# Patient Record
Sex: Male | Born: 1960
Health system: Southern US, Community
[De-identification: ages and names within clinical notes are randomized; demographics above are authoritative.]

## PROBLEM LIST (undated history)

## (undated) DIAGNOSIS — K219 Gastro-esophageal reflux disease without esophagitis: Secondary | ICD-10-CM

## (undated) DIAGNOSIS — R7303 Prediabetes: Secondary | ICD-10-CM

## (undated) DIAGNOSIS — I519 Heart disease, unspecified: Secondary | ICD-10-CM

## (undated) DIAGNOSIS — I77810 Thoracic aortic ectasia: Secondary | ICD-10-CM

## (undated) DIAGNOSIS — N471 Phimosis: Secondary | ICD-10-CM

## (undated) DIAGNOSIS — G4733 Obstructive sleep apnea (adult) (pediatric): Secondary | ICD-10-CM

## (undated) DIAGNOSIS — N481 Balanitis: Secondary | ICD-10-CM

## (undated) DIAGNOSIS — I10 Essential (primary) hypertension: Secondary | ICD-10-CM

## (undated) DIAGNOSIS — K429 Umbilical hernia without obstruction or gangrene: Secondary | ICD-10-CM

## (undated) DIAGNOSIS — Q208 Other congenital malformations of cardiac chambers and connections: Secondary | ICD-10-CM

## (undated) DIAGNOSIS — Z972 Presence of dental prosthetic device (complete) (partial): Secondary | ICD-10-CM

## (undated) DIAGNOSIS — R0600 Dyspnea, unspecified: Secondary | ICD-10-CM

## (undated) DIAGNOSIS — E669 Obesity, unspecified: Secondary | ICD-10-CM

## (undated) DIAGNOSIS — E785 Hyperlipidemia, unspecified: Secondary | ICD-10-CM

## (undated) DIAGNOSIS — Z8659 Personal history of other mental and behavioral disorders: Secondary | ICD-10-CM

## (undated) DIAGNOSIS — E291 Testicular hypofunction: Secondary | ICD-10-CM

## (undated) DIAGNOSIS — I288 Other diseases of pulmonary vessels: Secondary | ICD-10-CM

## (undated) HISTORY — DX: Gastro-esophageal reflux disease without esophagitis: K21.9

## (undated) HISTORY — DX: Other diseases of pulmonary vessels: I28.8

## (undated) HISTORY — DX: Other congenital malformations of cardiac chambers and connections: Q20.8

## (undated) HISTORY — DX: Prediabetes: R73.03

## (undated) HISTORY — DX: Heart disease, unspecified: I51.9

## (undated) HISTORY — DX: Obstructive sleep apnea (adult) (pediatric): G47.33

## (undated) HISTORY — DX: Obesity, unspecified: E66.9

## (undated) HISTORY — PX: VASECTOMY: SHX75

## (undated) HISTORY — DX: Essential (primary) hypertension: I10

## (undated) HISTORY — DX: Testicular hypofunction: E29.1

## (undated) HISTORY — PX: APPENDECTOMY: SHX54

## (undated) HISTORY — PX: NO PAST SURGERIES: SHX2092

## (undated) HISTORY — DX: Hyperlipidemia, unspecified: E78.5

## (undated) HISTORY — DX: Personal history of other mental and behavioral disorders: Z86.59

## (undated) HISTORY — DX: Thoracic aortic ectasia: I77.810

## (undated) HISTORY — DX: Dyspnea, unspecified: R06.00

---

## 2002-09-15 ENCOUNTER — Encounter: Admission: RE | Admit: 2002-09-15 | Discharge: 2002-09-15 | Payer: Self-pay | Admitting: Otolaryngology

## 2002-09-15 ENCOUNTER — Encounter: Payer: Self-pay | Admitting: Otolaryngology

## 2002-11-24 ENCOUNTER — Ambulatory Visit (HOSPITAL_COMMUNITY): Admission: RE | Admit: 2002-11-24 | Discharge: 2002-11-24 | Payer: Self-pay | Admitting: Gastroenterology

## 2003-07-30 ENCOUNTER — Ambulatory Visit (HOSPITAL_COMMUNITY): Admission: RE | Admit: 2003-07-30 | Discharge: 2003-07-30 | Payer: Self-pay | Admitting: Family Medicine

## 2004-07-15 ENCOUNTER — Emergency Department (HOSPITAL_COMMUNITY): Admission: EM | Admit: 2004-07-15 | Discharge: 2004-07-15 | Payer: Self-pay | Admitting: *Deleted

## 2004-08-13 ENCOUNTER — Encounter: Admission: RE | Admit: 2004-08-13 | Discharge: 2004-08-13 | Payer: Self-pay | Admitting: Orthopaedic Surgery

## 2012-12-20 ENCOUNTER — Emergency Department: Payer: Self-pay | Admitting: Unknown Physician Specialty

## 2013-09-19 ENCOUNTER — Other Ambulatory Visit: Payer: Self-pay | Admitting: Family Medicine

## 2013-09-19 ENCOUNTER — Ambulatory Visit
Admission: RE | Admit: 2013-09-19 | Discharge: 2013-09-19 | Disposition: A | Discharge status: Home / Self Care | Payer: BC Managed Care – PPO | Source: Ambulatory Visit | Attending: Family Medicine | Admitting: Family Medicine

## 2013-09-19 DIAGNOSIS — M5430 Sciatica, unspecified side: Secondary | ICD-10-CM

## 2014-02-23 ENCOUNTER — Ambulatory Visit (INDEPENDENT_AMBULATORY_CARE_PROVIDER_SITE_OTHER): Payer: BC Managed Care – PPO | Admitting: Surgery

## 2014-03-16 ENCOUNTER — Ambulatory Visit (INDEPENDENT_AMBULATORY_CARE_PROVIDER_SITE_OTHER): Payer: BC Managed Care – PPO | Admitting: Surgery

## 2015-03-29 ENCOUNTER — Other Ambulatory Visit: Payer: Self-pay | Admitting: General Surgery

## 2015-07-11 ENCOUNTER — Other Ambulatory Visit (HOSPITAL_COMMUNITY): Payer: Self-pay | Admitting: Family Medicine

## 2015-07-11 ENCOUNTER — Telehealth (HOSPITAL_COMMUNITY): Payer: Self-pay | Admitting: *Deleted

## 2015-07-11 DIAGNOSIS — R0609 Other forms of dyspnea: Principal | ICD-10-CM

## 2015-07-18 ENCOUNTER — Other Ambulatory Visit: Payer: Self-pay | Admitting: Family Medicine

## 2015-07-18 ENCOUNTER — Ambulatory Visit
Admission: RE | Admit: 2015-07-18 | Discharge: 2015-07-18 | Disposition: A | Discharge status: Home / Self Care | Payer: BLUE CROSS/BLUE SHIELD | Source: Ambulatory Visit | Attending: Family Medicine | Admitting: Family Medicine

## 2015-07-18 ENCOUNTER — Ambulatory Visit (HOSPITAL_COMMUNITY): Payer: BLUE CROSS/BLUE SHIELD | Attending: Cardiovascular Disease

## 2015-07-18 ENCOUNTER — Other Ambulatory Visit: Payer: Self-pay

## 2015-07-18 ENCOUNTER — Encounter: Payer: Self-pay | Admitting: Cardiology

## 2015-07-18 DIAGNOSIS — Z87891 Personal history of nicotine dependence: Secondary | ICD-10-CM | POA: Insufficient documentation

## 2015-07-18 DIAGNOSIS — R079 Chest pain, unspecified: Secondary | ICD-10-CM

## 2015-07-18 DIAGNOSIS — I517 Cardiomegaly: Secondary | ICD-10-CM | POA: Diagnosis not present

## 2015-07-18 DIAGNOSIS — R0609 Other forms of dyspnea: Secondary | ICD-10-CM | POA: Insufficient documentation

## 2015-07-18 DIAGNOSIS — E669 Obesity, unspecified: Secondary | ICD-10-CM | POA: Diagnosis not present

## 2015-07-18 DIAGNOSIS — Z6835 Body mass index (BMI) 35.0-35.9, adult: Secondary | ICD-10-CM | POA: Diagnosis not present

## 2015-07-18 DIAGNOSIS — R0600 Dyspnea, unspecified: Secondary | ICD-10-CM | POA: Diagnosis present

## 2015-07-30 DIAGNOSIS — Z8659 Personal history of other mental and behavioral disorders: Secondary | ICD-10-CM | POA: Insufficient documentation

## 2015-07-30 DIAGNOSIS — E669 Obesity, unspecified: Secondary | ICD-10-CM | POA: Insufficient documentation

## 2015-07-30 DIAGNOSIS — G4733 Obstructive sleep apnea (adult) (pediatric): Secondary | ICD-10-CM | POA: Insufficient documentation

## 2015-07-30 DIAGNOSIS — K219 Gastro-esophageal reflux disease without esophagitis: Secondary | ICD-10-CM | POA: Insufficient documentation

## 2015-07-30 DIAGNOSIS — R7303 Prediabetes: Secondary | ICD-10-CM | POA: Insufficient documentation

## 2015-08-02 ENCOUNTER — Ambulatory Visit (INDEPENDENT_AMBULATORY_CARE_PROVIDER_SITE_OTHER): Payer: BLUE CROSS/BLUE SHIELD | Admitting: Cardiology

## 2015-08-02 ENCOUNTER — Encounter: Payer: Self-pay | Admitting: Cardiology

## 2015-08-02 VITALS — BP 118/86 | HR 73 | Ht 66.5 in | Wt 232.4 lb

## 2015-08-02 DIAGNOSIS — G4730 Sleep apnea, unspecified: Secondary | ICD-10-CM

## 2015-08-02 DIAGNOSIS — R7303 Prediabetes: Secondary | ICD-10-CM | POA: Diagnosis not present

## 2015-08-02 DIAGNOSIS — R0789 Other chest pain: Secondary | ICD-10-CM

## 2015-08-02 DIAGNOSIS — R0609 Other forms of dyspnea: Secondary | ICD-10-CM

## 2015-08-02 NOTE — Patient Instructions (Signed)
Medication Instructions:  The current medical regimen is effective;  continue present plan and medications.  Labwork: Please have fasting blood work (lipid)  Testing/Procedures: Your physician has requested that you have an exercise tolerance test. For further information please visit [email or web address] Please also follow instruction sheet, as given.  Follow-Up: Follow up with Dr Delton See ASAP.  If you need a refill on your cardiac medications before your next appointment, please call your pharmacy.  Thank you for choosing [facility]!!

## 2015-08-02 NOTE — Progress Notes (Addendum)
Cardiology Office Note   Date:  08/02/2015   ID:  Cody Villegas, DOB 26-Jun-1961, MRN [id number]  PCP:  Frederich Chick, MD  Cardiologist:  Dr. Carola Rhine    Chief Complaint  Patient presents with  . dyspnea on exertion  . Fatigue      History of Present Illness: Cody Villegas is a 55 y.o. male who presents for episodes of chest discomfort and SOB.  Referred by Dr. Hyman Hopes.  No prior cardiac hx.  He underwent echo 07/18/15   Left ventricle: The cavity size was mildly dilated. There was mild focal basal hypertrophy of the septum. Systolic function was normal. The estimated ejection fraction was in the range of 55% to 60%. Wall motion was normal; there were no regional wall motion abnormalities. Doppler parameters are consistent with abnormal left ventricular relaxation (grade 1 diastolic dysfunction). - Mitral valve: Redundant sub chordal apparatus. - Right ventricle: The cavity size was mildly dilated. Wall thickness was normal. - Atrial septum: No defect or patent foramen ovale was identified.  EKG from PCP with Q wave III. SR.   LABS:  BNP < 5, HGBA1c 5.9,  H/H stable Cr 0.92,  LFTs normal.  PSA 0.49 TSH  2.36  CXR with no cardiopulmonary abnormalities, + thoracic vertebrae fx chronic    He had a sleep study in 2007 but very mild so no treatment. No weight changes since.   Today he stated he develops chest discomfort a pressure with some pain going down arm more with waking stairs does not last long.  Also with SOB again resolves quickly.  He can play racketball without problems.  Also periodic neck pain.  Hx GERD, no recent lipids. Pt is adopted so no known family hx.     Past Medical History  Diagnosis Date  . Prediabetes   . Obesity   . Hx of major depression     resolved per pt 04/2006  . OSA (obstructive sleep apnea)   . Esophageal reflux   . Hypogonadism, male   . Dyspnea     Past Surgical History  Procedure Laterality Date  . No past  surgeries       Current Outpatient Prescriptions  Medication Sig Dispense Refill  . aspirin 81 MG tablet Take 81 mg by mouth daily.    . cetirizine (ZYRTEC) 10 MG tablet Take 10 mg by mouth daily.    . fluticasone (FLONASE) 50 MCG/ACT nasal spray Place 1 spray into both nostrils daily.    Marland Kitchen omeprazole (PRILOSEC) 40 MG capsule Take 40 mg by mouth daily as needed.     No current facility-administered medications for this visit.    Allergies:   Septra    Social History:  The patient  reports that he has never smoked. He does not have any smokeless tobacco history on file.   Family History:  The patient's family history is not on file. He was adopted.    ROS:  General:no colds or fevers, no weight changes Skin:no rashes or ulcers HEENT:no blurred vision, no congestion CV:see HPI PUL:see HPI GI:no diarrhea constipation or melena, no indigestion GU:no hematuria, no dysuria MS:no joint pain, no claudication Neuro:no syncope, no lightheadedness Endo:pre- diabetes, no thyroid disease  Wt Readings from Last 3 Encounters:  08/02/15 232 lb 6.4 oz (105.416 kg)     PHYSICAL EXAM: VS:  BP 118/86 mmHg  Pulse 73  Ht 5' 6.5" (1.689 m)  Wt 232 lb 6.4 oz (105.416 kg)  BMI 36.95 kg/m2 ,  BMI Body mass index is 36.95 kg/(m^2). General:Pleasant affect, NAD Skin:Warm and dry, brisk capillary refill HEENT:normocephalic, sclera clear, mucus membranes moist Neck:supple, no JVD, no bruits  Heart:S1S2 RRR without murmur, gallup, rub or click Lungs:clear without rales, rhonchi, or wheezes ZOX:WRUE, non tender, + BS, do not palpate liver spleen or masses Ext:no lower ext edema, 2+ pedal pulses, 2+ radial pulses Neuro:alert and oriented, MAE, follows commands, + facial symmetry    EKG:  EKG is ordered today. The ekg ordered today demonstrates SR with Q wave in III, no changes from previous.    Recent Labs: No results found for requested labs within last 365 days.    Lipid Panel No  results found for: CHOL, TRIG, HDL, CHOLHDL, VLDL, LDLCALC, LDLDIRECT     Other studies Reviewed: Additional studies/ records that were reviewed today include: labs and echo.   ASSESSMENT AND PLAN:  1.  Chest pain and SOB with exertion.  Will plan ETT.  Discussed with Dr. Excell Seltzer.  EKG without acute changes and Echo with G1DD.  Will check lipids.  Pt will follow up with Dr. Delton See.  Though if positive will address prior to appt. With her direction.      Current medicines are reviewed with the patient today.  The patient Has no concerns regarding medicines.  The following changes have been made:  See above Labs/ tests ordered today include:see above  Disposition:   FU:  see above  Signed, Leone Brand, NP  08/02/2015 8:52 AM    [facility] [address] [place], [place], [place]  [address]/ [address] [place], [place] Phone: [phone]; Fax: [phone]  [phone]

## 2015-08-13 ENCOUNTER — Telehealth (HOSPITAL_COMMUNITY): Payer: Self-pay

## 2015-08-13 NOTE — Telephone Encounter (Signed)
Encounter complete. 

## 2015-08-15 ENCOUNTER — Ambulatory Visit (HOSPITAL_COMMUNITY)
Admission: RE | Admit: 2015-08-15 | Discharge: 2015-08-15 | Disposition: A | Discharge status: Home / Self Care | Payer: BLUE CROSS/BLUE SHIELD | Source: Ambulatory Visit | Attending: Cardiology | Admitting: Cardiology

## 2015-08-15 ENCOUNTER — Other Ambulatory Visit (INDEPENDENT_AMBULATORY_CARE_PROVIDER_SITE_OTHER): Payer: BLUE CROSS/BLUE SHIELD | Admitting: *Deleted

## 2015-08-15 DIAGNOSIS — R0609 Other forms of dyspnea: Secondary | ICD-10-CM | POA: Insufficient documentation

## 2015-08-15 LAB — EXERCISE TOLERANCE TEST
CHL CUP MPHR: 167 {beats}/min
CHL RATE OF PERCEIVED EXERTION: 17
CSEPEDS: 4 s
CSEPEW: 11.8 METS
Exercise duration (min): 10 min
Peak HR: 160 {beats}/min
Percent HR: 95 %
Rest HR: 75 {beats}/min

## 2015-08-15 LAB — LIPID PANEL
CHOL/HDL RATIO: 3.8 ratio (ref <=5.0)
CHOLESTEROL: 151 mg/dL (ref 125–200)
HDL: 40 mg/dL (ref >=40)
LDL Cholesterol: 78 mg/dL (ref <130)
TRIGLYCERIDES: 165 mg/dL — AB (ref <150)
VLDL: 33 mg/dL — ABNORMAL HIGH (ref <30)

## 2015-08-16 ENCOUNTER — Telehealth: Payer: Self-pay | Admitting: *Deleted

## 2015-08-16 DIAGNOSIS — R0609 Other forms of dyspnea: Principal | ICD-10-CM

## 2015-08-16 DIAGNOSIS — R0689 Other abnormalities of breathing: Secondary | ICD-10-CM

## 2015-08-16 NOTE — Telephone Encounter (Signed)
Per Nada Boozer, NP, called pt and advised him that his ETT was normal and that Dr. Delton See wanted him to have a Lexiscan done.   Instructions for the Lexiscan have been discussed with the pt.  Order has been in EPIC.  Pt has been advised that someone from our office will call him and schedule. Pt verbalized understanding.

## 2015-08-20 ENCOUNTER — Telehealth (HOSPITAL_COMMUNITY): Payer: Self-pay | Admitting: *Deleted

## 2015-08-20 NOTE — Telephone Encounter (Signed)
Patient given detailed instructions per Myocardial Perfusion Study Information Sheet for the test on 08/22/15 at 745 Patient notified to arrive 15 minutes early and that it is imperative to arrive on time for appointment to keep from having the test rescheduled.  If you need to cancel or reschedule your appointment, please call the office within 24 hours of your appointment. Failure to do so may result in a cancellation of your appointment, and a $50 no show fee. Patient verbalized understanding.Darrielle Pflieger J Kashmere Daywalt, RN  

## 2015-08-22 ENCOUNTER — Ambulatory Visit (HOSPITAL_COMMUNITY): Payer: BLUE CROSS/BLUE SHIELD | Attending: Cardiovascular Disease

## 2015-08-22 DIAGNOSIS — R5383 Other fatigue: Secondary | ICD-10-CM | POA: Diagnosis not present

## 2015-08-22 DIAGNOSIS — I10 Essential (primary) hypertension: Secondary | ICD-10-CM | POA: Diagnosis not present

## 2015-08-22 DIAGNOSIS — R079 Chest pain, unspecified: Secondary | ICD-10-CM | POA: Insufficient documentation

## 2015-08-22 DIAGNOSIS — R0609 Other forms of dyspnea: Secondary | ICD-10-CM | POA: Diagnosis present

## 2015-08-22 DIAGNOSIS — R9439 Abnormal result of other cardiovascular function study: Secondary | ICD-10-CM | POA: Insufficient documentation

## 2015-08-22 LAB — MYOCARDIAL PERFUSION IMAGING
CHL CUP RESTING HR STRESS: 66 {beats}/min
CSEPPHR: 90 {beats}/min
LHR: 0.27
LVDIAVOL: 115 mL
LVSYSVOL: 49 mL
NUC STRESS TID: 0.93
SDS: 4
SRS: 3
SSS: 5

## 2015-08-22 MED ORDER — TECHNETIUM TC 99M SESTAMIBI GENERIC - CARDIOLITE
32.4000 | Freq: Once | INTRAVENOUS | Status: AC | PRN
  Administered 2015-08-22: 32 via INTRAVENOUS

## 2015-08-22 MED ORDER — TECHNETIUM TC 99M SESTAMIBI GENERIC - CARDIOLITE
10.2000 | Freq: Once | INTRAVENOUS | Status: AC | PRN
  Administered 2015-08-22: 10 via INTRAVENOUS

## 2015-08-22 MED ORDER — REGADENOSON 0.4 MG/5ML IV SOLN
0.4000 mg | Freq: Once | INTRAVENOUS | Status: AC
  Administered 2015-08-22: 0.4 mg via INTRAVENOUS

## 2015-08-23 ENCOUNTER — Telehealth: Payer: Self-pay | Admitting: Cardiology

## 2015-08-23 NOTE — Telephone Encounter (Signed)
Returned pts call and he has been made aware of his stress test results. 

## 2015-08-23 NOTE — Telephone Encounter (Signed)
Returning call from this morning,does not know who called.He says it might be his test results.

## 2015-09-03 NOTE — Progress Notes (Signed)
Patient ID: Cody Villegas, male   DOB: 1960/07/31, 55 y.o.   MRN: [id number]    Cardiology Office Note  Date:  09/03/2015   ID:  Cody Villegas, DOB December 15, 1960, MRN [id number]  PCP:  Cody Chick, MD  Cardiologist:  Dr. Carola Villegas    Chief complain: Chest pain   History of Present Illness: Cody Villegas is a 55 y.o. male who presents for episodes of chest discomfort and SOB.  Referred by Dr. Hyman Villegas.  No prior cardiac hx.  He underwent echo 07/18/15   Left ventricle: The cavity size was mildly dilated. There was mild focal basal hypertrophy of the septum. Systolic function was normal. The estimated ejection fraction was in the range of 55% to 60%. Wall motion was normal; there were no regional wall motion abnormalities. Doppler parameters are consistent with abnormal left ventricular relaxation (grade 1 diastolic dysfunction). - Mitral valve: Redundant sub chordal apparatus. - Right ventricle: The cavity size was mildly dilated. Wall thickness was normal. - Atrial septum: No defect or patent foramen ovale was identified.  EKG from PCP with Q wave III. SR.   LABS:  BNP < 5, HGBA1c 5.9,  Villegas/Villegas stable Cr 0.92,  LFTs normal.  PSA 0.49 TSH  2.36  CXR with no cardiopulmonary abnormalities, + thoracic vertebrae fx chronic    He had a sleep study in 2007 but very mild so no treatment. No weight changes since.   09/04/15 - Today he stated he develops chest discomfort and cough in the last few days, no fever or chills, he walks a lot at work and has nonexertional chest pain palpitations or syncope. He states that his diet has a lot of from for improvement, Hx GERD. Pt is adopted so no known family hx.    Past Medical History  Diagnosis Date  . Prediabetes   . Obesity   . Hx of major depression     resolved per pt 04/2006  . OSA (obstructive sleep apnea)   . Esophageal reflux   . Hypogonadism, male   . Dyspnea     Past Surgical History  Procedure Laterality Date  . No  past surgeries      Current Outpatient Prescriptions  Medication Sig Dispense Refill  . aspirin 81 MG tablet Take 81 mg by mouth daily.    . cetirizine (ZYRTEC) 10 MG tablet Take 10 mg by mouth daily.    . fluticasone (FLONASE) 50 MCG/ACT nasal spray Place 1 spray into both nostrils daily.    Marland Kitchen omeprazole (PRILOSEC) 40 MG capsule Take 40 mg by mouth daily as needed.     No current facility-administered medications for this visit.   Allergies:   Septra   Social History:  The patient  reports that he has never smoked. He does not have any smokeless tobacco history on file.   Family History:  The patient's family history is not on file. He was adopted.   ROS:  General:no colds or fevers, no weight changes Skin:no rashes or ulcers HEENT:no blurred vision, no congestion CV:see HPI PUL:see HPI GI:no diarrhea constipation or melena, no indigestion GU:no hematuria, no dysuria MS:no joint pain, no claudication Neuro:no syncope, no lightheadedness Endo:pre- diabetes, no thyroid disease  Wt Readings from Last 3 Encounters:  08/02/15 232 lb 6.4 oz (105.416 kg)    PHYSICAL EXAM: VS:  There were no vitals taken for this visit. , BMI There is no weight on file to calculate BMI. General:Pleasant affect, NAD Skin:Warm and dry, brisk capillary refill  HEENT:normocephalic, sclera clear, mucus membranes moist Neck:supple, no JVD, no bruits  Heart:S1S2 RRR without murmur, gallup, rub or click Lungs:clear without rales, rhonchi, or wheezes ZOX:WRUEAbd:soft, non tender, + BS, do not palpate liver spleen or masses Ext:no lower ext edema, 2+ pedal pulses, 2+ radial pulses Neuro:alert and oriented, MAE, follows commands, + facial symmetry  EKG:  EKG is ordered today. The ekg ordered today demonstrates SR with Q wave in III, no changes from previous.   Recent Labs: No results found for requested labs within last 365 days.   Lipid Panel    Component Value Date/Time   CHOL 151 08/15/2015 0907   TRIG  165* 08/15/2015 0907   HDL 40 08/15/2015 0907   CHOLHDL 3.8 08/15/2015 0907   VLDL 33* 08/15/2015 0907   LDLCALC 78 08/15/2015 0907   Lexiscan nuclear stress test: 08/22/2015  The left ventricular ejection fraction is normal (55-65%).  Nuclear stress EF: 57%.  There was no ST segment deviation noted during stress.  This is a low risk study.  Low risk stress nuclear study with a small, moderate intensity, fixed apical defect consistent with thinning; no ischemia; EF 57 with normal wall motion.    ASSESSMENT AND PLAN:  1.  Chest pain and SOB with exertion.  Normal ETT and Lexiscan nuclear scan, LVEF 57%. EKG without acute changes and Echo with G1DD.   No further ischemic workup.  2. Hyperlipidemia - elevated TAG 165, LDL 78, HDL 40. He's advised on lifestyle modifications.  Follow up in 2 years.   Signed, Cody Villegas, Cody Villegas H, MD  09/03/2015 8:03 AM    [facility] [address], [place], [place]  [address]/ [address] [place], [place] Phone: [phone]; Fax: [phone]  [phone]

## 2015-09-04 ENCOUNTER — Ambulatory Visit (INDEPENDENT_AMBULATORY_CARE_PROVIDER_SITE_OTHER): Payer: BLUE CROSS/BLUE SHIELD | Admitting: Cardiology

## 2015-09-04 ENCOUNTER — Encounter: Payer: Self-pay | Admitting: Cardiology

## 2015-09-04 VITALS — BP 120/62 | HR 84 | Ht 65.0 in | Wt 231.0 lb

## 2015-09-04 DIAGNOSIS — E785 Hyperlipidemia, unspecified: Secondary | ICD-10-CM

## 2015-09-04 DIAGNOSIS — R072 Precordial pain: Secondary | ICD-10-CM | POA: Diagnosis not present

## 2015-09-04 NOTE — Patient Instructions (Signed)
Your physician recommends that you continue on your current medications as directed. Please refer to the Current Medication list given to you today.    Your physician wants you to follow-up in: 2 YEARS WITH DR NELSON You will receive a reminder letter in the mail two months in advance. If you don't receive a letter, please call our office to schedule the follow-up appointment.  

## 2016-01-08 DIAGNOSIS — F4323 Adjustment disorder with mixed anxiety and depressed mood: Secondary | ICD-10-CM | POA: Diagnosis not present

## 2016-02-11 DIAGNOSIS — F4323 Adjustment disorder with mixed anxiety and depressed mood: Secondary | ICD-10-CM | POA: Diagnosis not present

## 2016-02-20 DIAGNOSIS — F4323 Adjustment disorder with mixed anxiety and depressed mood: Secondary | ICD-10-CM | POA: Diagnosis not present

## 2016-02-27 DIAGNOSIS — F4323 Adjustment disorder with mixed anxiety and depressed mood: Secondary | ICD-10-CM | POA: Diagnosis not present

## 2016-04-08 DIAGNOSIS — K6289 Other specified diseases of anus and rectum: Secondary | ICD-10-CM | POA: Diagnosis not present

## 2016-04-14 DIAGNOSIS — F4323 Adjustment disorder with mixed anxiety and depressed mood: Secondary | ICD-10-CM | POA: Diagnosis not present

## 2016-04-27 DIAGNOSIS — K429 Umbilical hernia without obstruction or gangrene: Secondary | ICD-10-CM | POA: Diagnosis not present

## 2016-04-27 DIAGNOSIS — M6208 Separation of muscle (nontraumatic), other site: Secondary | ICD-10-CM | POA: Diagnosis not present

## 2016-04-27 DIAGNOSIS — K641 Second degree hemorrhoids: Secondary | ICD-10-CM | POA: Diagnosis not present

## 2016-04-27 DIAGNOSIS — K645 Perianal venous thrombosis: Secondary | ICD-10-CM | POA: Diagnosis not present

## 2016-05-07 DIAGNOSIS — F4323 Adjustment disorder with mixed anxiety and depressed mood: Secondary | ICD-10-CM | POA: Diagnosis not present

## 2016-06-02 DIAGNOSIS — F4323 Adjustment disorder with mixed anxiety and depressed mood: Secondary | ICD-10-CM | POA: Diagnosis not present

## 2016-06-16 DIAGNOSIS — F4323 Adjustment disorder with mixed anxiety and depressed mood: Secondary | ICD-10-CM | POA: Diagnosis not present

## 2016-07-07 DIAGNOSIS — F4323 Adjustment disorder with mixed anxiety and depressed mood: Secondary | ICD-10-CM | POA: Diagnosis not present

## 2016-07-28 DIAGNOSIS — Z125 Encounter for screening for malignant neoplasm of prostate: Secondary | ICD-10-CM | POA: Diagnosis not present

## 2016-07-28 DIAGNOSIS — E782 Mixed hyperlipidemia: Secondary | ICD-10-CM | POA: Diagnosis not present

## 2016-07-28 DIAGNOSIS — R7303 Prediabetes: Secondary | ICD-10-CM | POA: Diagnosis not present

## 2016-07-28 DIAGNOSIS — Z6835 Body mass index (BMI) 35.0-35.9, adult: Secondary | ICD-10-CM | POA: Diagnosis not present

## 2016-07-28 DIAGNOSIS — Z0000 Encounter for general adult medical examination without abnormal findings: Secondary | ICD-10-CM | POA: Diagnosis not present

## 2016-08-04 DIAGNOSIS — F4323 Adjustment disorder with mixed anxiety and depressed mood: Secondary | ICD-10-CM | POA: Diagnosis not present

## 2016-09-03 DIAGNOSIS — F4323 Adjustment disorder with mixed anxiety and depressed mood: Secondary | ICD-10-CM | POA: Diagnosis not present

## 2016-09-10 ENCOUNTER — Ambulatory Visit
Admission: RE | Admit: 2016-09-10 | Discharge: 2016-09-10 | Disposition: A | Discharge status: Home / Self Care | Payer: BLUE CROSS/BLUE SHIELD | Source: Ambulatory Visit | Attending: Family Medicine | Admitting: Family Medicine

## 2016-09-10 ENCOUNTER — Other Ambulatory Visit: Payer: Self-pay | Admitting: Family Medicine

## 2016-09-10 DIAGNOSIS — M25511 Pain in right shoulder: Secondary | ICD-10-CM

## 2016-09-10 DIAGNOSIS — G8929 Other chronic pain: Secondary | ICD-10-CM

## 2016-09-10 DIAGNOSIS — M545 Low back pain: Secondary | ICD-10-CM | POA: Diagnosis not present

## 2016-09-10 DIAGNOSIS — M19011 Primary osteoarthritis, right shoulder: Secondary | ICD-10-CM | POA: Diagnosis not present

## 2016-09-22 DIAGNOSIS — F4323 Adjustment disorder with mixed anxiety and depressed mood: Secondary | ICD-10-CM | POA: Diagnosis not present

## 2016-10-22 DIAGNOSIS — F4323 Adjustment disorder with mixed anxiety and depressed mood: Secondary | ICD-10-CM | POA: Diagnosis not present

## 2016-10-28 DIAGNOSIS — Z125 Encounter for screening for malignant neoplasm of prostate: Secondary | ICD-10-CM | POA: Diagnosis not present

## 2016-11-19 DIAGNOSIS — F4323 Adjustment disorder with mixed anxiety and depressed mood: Secondary | ICD-10-CM | POA: Diagnosis not present

## 2016-12-03 DIAGNOSIS — F4323 Adjustment disorder with mixed anxiety and depressed mood: Secondary | ICD-10-CM | POA: Diagnosis not present

## 2016-12-09 DIAGNOSIS — Z5181 Encounter for therapeutic drug level monitoring: Secondary | ICD-10-CM | POA: Diagnosis not present

## 2016-12-09 DIAGNOSIS — G8929 Other chronic pain: Secondary | ICD-10-CM | POA: Diagnosis not present

## 2016-12-09 DIAGNOSIS — M791 Myalgia: Secondary | ICD-10-CM | POA: Diagnosis not present

## 2016-12-09 DIAGNOSIS — M25561 Pain in right knee: Secondary | ICD-10-CM | POA: Diagnosis not present

## 2016-12-09 DIAGNOSIS — R399 Unspecified symptoms and signs involving the genitourinary system: Secondary | ICD-10-CM | POA: Diagnosis not present

## 2016-12-14 ENCOUNTER — Other Ambulatory Visit: Payer: Self-pay | Admitting: Family Medicine

## 2016-12-14 ENCOUNTER — Ambulatory Visit
Admission: RE | Admit: 2016-12-14 | Discharge: 2016-12-14 | Disposition: A | Discharge status: Home / Self Care | Payer: BLUE CROSS/BLUE SHIELD | Source: Ambulatory Visit | Attending: Family Medicine | Admitting: Family Medicine

## 2016-12-14 DIAGNOSIS — G8929 Other chronic pain: Secondary | ICD-10-CM

## 2016-12-14 DIAGNOSIS — M1711 Unilateral primary osteoarthritis, right knee: Secondary | ICD-10-CM | POA: Diagnosis not present

## 2017-01-05 DIAGNOSIS — F4323 Adjustment disorder with mixed anxiety and depressed mood: Secondary | ICD-10-CM | POA: Diagnosis not present

## 2017-01-11 DIAGNOSIS — F4323 Adjustment disorder with mixed anxiety and depressed mood: Secondary | ICD-10-CM | POA: Diagnosis not present

## 2017-02-04 DIAGNOSIS — F4323 Adjustment disorder with mixed anxiety and depressed mood: Secondary | ICD-10-CM | POA: Diagnosis not present

## 2017-03-25 DIAGNOSIS — M1711 Unilateral primary osteoarthritis, right knee: Secondary | ICD-10-CM | POA: Diagnosis not present

## 2017-04-15 DIAGNOSIS — F4323 Adjustment disorder with mixed anxiety and depressed mood: Secondary | ICD-10-CM | POA: Diagnosis not present

## 2017-06-16 DIAGNOSIS — G8929 Other chronic pain: Secondary | ICD-10-CM | POA: Diagnosis not present

## 2017-06-16 DIAGNOSIS — R5382 Chronic fatigue, unspecified: Secondary | ICD-10-CM | POA: Diagnosis not present

## 2017-06-16 DIAGNOSIS — E669 Obesity, unspecified: Secondary | ICD-10-CM | POA: Diagnosis not present

## 2017-06-16 DIAGNOSIS — F4323 Adjustment disorder with mixed anxiety and depressed mood: Secondary | ICD-10-CM | POA: Diagnosis not present

## 2017-08-12 DIAGNOSIS — F4323 Adjustment disorder with mixed anxiety and depressed mood: Secondary | ICD-10-CM | POA: Diagnosis not present

## 2017-09-02 ENCOUNTER — Ambulatory Visit: Payer: BLUE CROSS/BLUE SHIELD | Admitting: Cardiology

## 2017-09-02 ENCOUNTER — Encounter: Payer: Self-pay | Admitting: Cardiology

## 2017-09-02 VITALS — BP 130/86 | HR 87 | Ht 66.5 in | Wt 228.0 lb

## 2017-09-02 DIAGNOSIS — E785 Hyperlipidemia, unspecified: Secondary | ICD-10-CM

## 2017-09-02 DIAGNOSIS — R072 Precordial pain: Secondary | ICD-10-CM | POA: Diagnosis not present

## 2017-09-02 DIAGNOSIS — R079 Chest pain, unspecified: Secondary | ICD-10-CM

## 2017-09-02 DIAGNOSIS — R0609 Other forms of dyspnea: Secondary | ICD-10-CM | POA: Diagnosis not present

## 2017-09-02 MED ORDER — METOPROLOL TARTRATE 50 MG PO TABS: ORAL_TABLET | ORAL | 0 refills | Status: DC

## 2017-09-02 NOTE — Progress Notes (Signed)
09/02/2017 Orvilla FusMarty Donahey   10/22/1960  [id number]  Primary Physician Shirlean MylarWebb, Carol, MD Primary Cardiologist: Dr. Delton SeeNelson   Reason for Visit/CC: Exertional Dyspnea/ Chest discomfort  HPI:  Orvilla FusMarty Wille is a 57 y.o. male who is being seen today for exertional dyspnea. He has been followed by Dr. Delton SeeNelson. Last seen in 2017. He underwent evaluation at that time for CP and dyspnea. 2D echo showed normal LVEF and G1DD. No valvular abnormalities. He also had a ETT which was abnormal. Blood pressure demonstrated a hypotensive response to exercise in stage 3 going from 154/7390mmHg to 120/6796mmHg but then increased to 124/9890mmHg in recovery with a peak in recovery of 205/296mmHg. There was Horizontal to upsloping ST segment depression of 1 mm noted during stress in the inferor leads, returning to baseline after 1-5 minutes of recovery. Dr. Delton SeeNelson recommended further testing and ordered a NST. This showed a small, moderate intensity, fixed apical defect consistent with thinning; no ischemia; EF 57 with normal wall motion. No further ischemic w/u was recommended at that time.   Additional cardiac risk factors include DLD and obesity. No tobacco history and no HTN. He is not currently on medications for cholesterol. He has had mildly elevated TGs and LDLs in the 80s. He admits that his diet is poor. He eats out a lot and eats a lot of fast food/ junk food. He has some abdominal obesity. He is adopted thus he is unsure of his family history.   He presents to clinic today with complaint of exertional dyspnea/ mild chest discomfort walking up inclines and steps. This is new for him. He has no difficulties with flat surfaces. His EKG shows NSR with Qwave in III, no change from previous EKG. He is currently CP free. No dyspnea. Volume is stable. No signs of fluid overload. Lungs are CTAB. BP is 130/86. HR 87 bpm.    Cardiac Studies  NST 08/22/15 Study Highlights    The left ventricular ejection fraction is normal  (55-65%).  Nuclear stress EF: 57%.  There was no ST segment deviation noted during stress.  This is a low risk study.   Low risk stress nuclear study with a small, moderate intensity, fixed apical defect consistent with thinning; no ischemia; EF 57 with normal wall motion.   2D Echo 07/18/2015 Study Conclusions  - Left ventricle: The cavity size was mildly dilated. There was   mild focal basal hypertrophy of the septum. Systolic function was   normal. The estimated ejection fraction was in the range of 55%   to 60%. Wall motion was normal; there were no regional wall   motion abnormalities. Doppler parameters are consistent with   abnormal left ventricular relaxation (grade 1 diastolic   dysfunction). - Mitral valve: Redundant sub chordal apparatus. - Right ventricle: The cavity size was mildly dilated. Wall   thickness was normal. - Atrial septum: No defect or patent foramen ovale was identified.   Current Meds  Medication Sig  . fluticasone (FLONASE) 50 MCG/ACT nasal spray Place 1 spray into both nostrils daily.  Marland Kitchen. omeprazole (PRILOSEC) 40 MG capsule Take 40 mg by mouth daily as needed.   Allergies  Allergen Reactions  . Septra [Sulfamethoxazole-Trimethoprim] Rash   Past Medical History:  Diagnosis Date  . Dyspnea   . Esophageal reflux   . Hx of major depression    resolved per pt 04/2006  . Hypogonadism, male   . Obesity   . OSA (obstructive sleep apnea)   . Prediabetes  Family History  Adopted: Yes   Past Surgical History:  Procedure Laterality Date  . NO PAST SURGERIES     Social History   Socioeconomic History  . Marital status: Married    Spouse name: Not on file  . Number of children: Not on file  . Years of education: Not on file  . Highest education level: Not on file  Social Needs  . Financial resource strain: Not on file  . Food insecurity - worry: Not on file  . Food insecurity - inability: Not on file  . Transportation needs - medical:  Not on file  . Transportation needs - non-medical: Not on file  Occupational History  . Not on file  Tobacco Use  . Smoking status: Never Smoker  . Smokeless tobacco: Never Used  Substance and Sexual Activity  . Alcohol use: Not on file  . Drug use: No  . Sexual activity: Not on file  Other Topics Concern  . Not on file  Social History Narrative  . Not on file     Review of Systems: General: negative for chills, fever, night sweats or weight changes.  Cardiovascular: negative for chest pain, dyspnea on exertion, edema, orthopnea, palpitations, paroxysmal nocturnal dyspnea or shortness of breath Dermatological: negative for rash Respiratory: negative for cough or wheezing Urologic: negative for hematuria Abdominal: negative for nausea, vomiting, diarrhea, bright red blood per rectum, melena, or hematemesis Neurologic: negative for visual changes, syncope, or dizziness All other systems reviewed and are otherwise negative except as noted above.   Physical Exam:  Blood pressure 130/86, pulse 87, height 5' 6.5" (1.689 m), weight 228 lb (103.4 kg).  General appearance: alert, cooperative and no distress Neck: no carotid bruit and no JVD Lungs: clear to auscultation bilaterally Heart: regular rate and rhythm, S1, S2 normal, no murmur, click, rub or gallop Extremities: extremities normal, atraumatic, no cyanosis or edema Pulses: 2+ and symmetric Skin: Skin color, texture, turgor normal. No rashes or lesions Neurologic: Grossly normal  EKG NSR with Q wave in lead III, unchanged from previous -- personally reviewed   ASSESSMENT AND PLAN:   1. Exertional Dyspnea: occurs walking up stairs and inclines, new from baseline. No symptoms walking on flat surfaces. He also has mild exertional CP with stairs and inclines that improve with rest. He had a low risk NST and normal 2D echo in 2017. Cardiac risk factors include DLD and obesity. He is adopted, thus he has no record of family  history, thus unsure if high risk for premature CAD. His EKG shows a Q wave in lead III, also noted on previous EKGs. Given his symptoms and history, we will order a coronary CTA with calcium scoring to assess for coronary artery disease. Will prescribe low dose BB to take morning to test to help lower HR. We will also obtain an updated fasting lipid panel to reassess cholesterol. F/u after coronary CTA.    Akin Yi Delmer Islam, MHS [facility] 09/02/2017 3:56 PM

## 2017-09-02 NOTE — Patient Instructions (Addendum)
Medication Instructions:  Your physician recommends that you continue on your current medications as directed. Please refer to the Current Medication list given to you today.   Labwork: TODAY:  BMET  09/09/17: FASTING LIPID/LFT Testing/Procedures:  None ordered  Follow-Up: Your physician recommends that you schedule a follow-up appointment in: 1-2 WEEKS AFTER CT   Any Other Special Instructions Will Be Listed Below (If Applicable).   [facility] at xx:xx AM (30-45 minutes prior to test start time)  [facility] [address] [place], [place] [address] [phone]  Proceed to the [facility] Radiology Department (First Floor).  Please follow these instructions carefully (unless otherwise directed):  Hold all erectile dysfunction medications at least 48 hours prior to test.  On the Night Before the Test: . Drink plenty of water. . Do not consume any caffeinated/decaffeinated beverages or chocolate 12 hours prior to your test. . Do not take any antihistamines 12 hours prior to your test.   On the Day of the Test: . Drink plenty of water. Do not drink any water within one hour of the test. . Do not eat any food 4 hours prior to the test. . You may take your regular medications prior to the test. . Take 50 mg of lopressor (metoprolol) one hour before the test.  After the Test: . Drink plenty of water. . After receiving IV contrast, you may experience a mild flushed feeling. This is normal. . On occasion, you may experience a mild rash up to 24 hours after the test. This is not dangerous. If this occurs, you can take Benadryl 25 mg and increase your fluid intake. . If you experience trouble breathing, this can be serious. If it is severe call 911 IMMEDIATELY. If it is mild, please call our office. . If you take any of these medications: Glipizide/Metformin, Avandament, Glucavance, please do not take 48  hours after completing test.   If you need a refill on your cardiac medications before your next appointment, please call your pharmacy.

## 2017-09-03 LAB — BASIC METABOLIC PANEL
BUN / CREAT RATIO: 19 (ref 9–20)
BUN: 17 mg/dL (ref 6–24)
CHLORIDE: 100 mmol/L (ref 96–106)
CO2: 26 mmol/L (ref 20–29)
Calcium: 9.6 mg/dL (ref 8.7–10.2)
Creatinine, Ser: 0.9 mg/dL (ref 0.76–1.27)
GFR calc non Af Amer: 96 mL/min/{1.73_m2} (ref >59)
GFR, EST AFRICAN AMERICAN: 111 mL/min/{1.73_m2} (ref >59)
GLUCOSE: 95 mg/dL (ref 65–99)
Potassium: 4.2 mmol/L (ref 3.5–5.2)
Sodium: 139 mmol/L (ref 134–144)

## 2017-09-09 ENCOUNTER — Other Ambulatory Visit: Payer: BLUE CROSS/BLUE SHIELD

## 2017-09-09 DIAGNOSIS — R072 Precordial pain: Secondary | ICD-10-CM | POA: Diagnosis not present

## 2017-09-09 DIAGNOSIS — R079 Chest pain, unspecified: Secondary | ICD-10-CM | POA: Diagnosis not present

## 2017-09-09 DIAGNOSIS — R0609 Other forms of dyspnea: Secondary | ICD-10-CM | POA: Diagnosis not present

## 2017-09-09 DIAGNOSIS — E785 Hyperlipidemia, unspecified: Secondary | ICD-10-CM

## 2017-09-09 LAB — LIPID PANEL
Chol/HDL Ratio: 3.3 ratio (ref 0.0–5.0)
Cholesterol, Total: 157 mg/dL (ref 100–199)
HDL: 48 mg/dL (ref >39)
LDL CALC: 88 mg/dL (ref 0–99)
Triglycerides: 104 mg/dL (ref 0–149)
VLDL Cholesterol Cal: 21 mg/dL (ref 5–40)

## 2017-09-09 LAB — HEPATIC FUNCTION PANEL
ALBUMIN: 4.4 g/dL (ref 3.5–5.5)
ALT: 23 IU/L (ref 0–44)
AST: 27 IU/L (ref 0–40)
Alkaline Phosphatase: 113 IU/L (ref 39–117)
BILIRUBIN TOTAL: 0.4 mg/dL (ref 0.0–1.2)
BILIRUBIN, DIRECT: 0.1 mg/dL (ref 0.00–0.40)
TOTAL PROTEIN: 7.1 g/dL (ref 6.0–8.5)

## 2017-10-19 DIAGNOSIS — M5416 Radiculopathy, lumbar region: Secondary | ICD-10-CM | POA: Diagnosis not present

## 2017-10-19 DIAGNOSIS — M9903 Segmental and somatic dysfunction of lumbar region: Secondary | ICD-10-CM | POA: Diagnosis not present

## 2017-10-19 DIAGNOSIS — M9905 Segmental and somatic dysfunction of pelvic region: Secondary | ICD-10-CM | POA: Diagnosis not present

## 2017-10-19 DIAGNOSIS — M5431 Sciatica, right side: Secondary | ICD-10-CM | POA: Diagnosis not present

## 2017-10-20 DIAGNOSIS — M5431 Sciatica, right side: Secondary | ICD-10-CM | POA: Diagnosis not present

## 2017-10-20 DIAGNOSIS — M5416 Radiculopathy, lumbar region: Secondary | ICD-10-CM | POA: Diagnosis not present

## 2017-10-20 DIAGNOSIS — M9905 Segmental and somatic dysfunction of pelvic region: Secondary | ICD-10-CM | POA: Diagnosis not present

## 2017-10-20 DIAGNOSIS — M9903 Segmental and somatic dysfunction of lumbar region: Secondary | ICD-10-CM | POA: Diagnosis not present

## 2017-10-22 DIAGNOSIS — M5431 Sciatica, right side: Secondary | ICD-10-CM | POA: Diagnosis not present

## 2017-10-22 DIAGNOSIS — M9905 Segmental and somatic dysfunction of pelvic region: Secondary | ICD-10-CM | POA: Diagnosis not present

## 2017-10-22 DIAGNOSIS — M9903 Segmental and somatic dysfunction of lumbar region: Secondary | ICD-10-CM | POA: Diagnosis not present

## 2017-10-22 DIAGNOSIS — M5416 Radiculopathy, lumbar region: Secondary | ICD-10-CM | POA: Diagnosis not present

## 2017-10-26 DIAGNOSIS — M9903 Segmental and somatic dysfunction of lumbar region: Secondary | ICD-10-CM | POA: Diagnosis not present

## 2017-10-26 DIAGNOSIS — M9905 Segmental and somatic dysfunction of pelvic region: Secondary | ICD-10-CM | POA: Diagnosis not present

## 2017-10-26 DIAGNOSIS — M5416 Radiculopathy, lumbar region: Secondary | ICD-10-CM | POA: Diagnosis not present

## 2017-10-26 DIAGNOSIS — M5431 Sciatica, right side: Secondary | ICD-10-CM | POA: Diagnosis not present

## 2017-10-27 DIAGNOSIS — M5416 Radiculopathy, lumbar region: Secondary | ICD-10-CM | POA: Diagnosis not present

## 2017-10-27 DIAGNOSIS — M9905 Segmental and somatic dysfunction of pelvic region: Secondary | ICD-10-CM | POA: Diagnosis not present

## 2017-10-27 DIAGNOSIS — M9903 Segmental and somatic dysfunction of lumbar region: Secondary | ICD-10-CM | POA: Diagnosis not present

## 2017-10-27 DIAGNOSIS — M5431 Sciatica, right side: Secondary | ICD-10-CM | POA: Diagnosis not present

## 2017-11-02 DIAGNOSIS — M5431 Sciatica, right side: Secondary | ICD-10-CM | POA: Diagnosis not present

## 2017-11-02 DIAGNOSIS — M9903 Segmental and somatic dysfunction of lumbar region: Secondary | ICD-10-CM | POA: Diagnosis not present

## 2017-11-02 DIAGNOSIS — M9905 Segmental and somatic dysfunction of pelvic region: Secondary | ICD-10-CM | POA: Diagnosis not present

## 2017-11-02 DIAGNOSIS — M5416 Radiculopathy, lumbar region: Secondary | ICD-10-CM | POA: Diagnosis not present

## 2017-11-03 DIAGNOSIS — M9903 Segmental and somatic dysfunction of lumbar region: Secondary | ICD-10-CM | POA: Diagnosis not present

## 2017-11-03 DIAGNOSIS — M9905 Segmental and somatic dysfunction of pelvic region: Secondary | ICD-10-CM | POA: Diagnosis not present

## 2017-11-03 DIAGNOSIS — M5431 Sciatica, right side: Secondary | ICD-10-CM | POA: Diagnosis not present

## 2017-11-03 DIAGNOSIS — M5416 Radiculopathy, lumbar region: Secondary | ICD-10-CM | POA: Diagnosis not present

## 2017-11-09 DIAGNOSIS — M5416 Radiculopathy, lumbar region: Secondary | ICD-10-CM | POA: Diagnosis not present

## 2017-11-09 DIAGNOSIS — M5431 Sciatica, right side: Secondary | ICD-10-CM | POA: Diagnosis not present

## 2017-11-09 DIAGNOSIS — M9903 Segmental and somatic dysfunction of lumbar region: Secondary | ICD-10-CM | POA: Diagnosis not present

## 2017-11-09 DIAGNOSIS — M9905 Segmental and somatic dysfunction of pelvic region: Secondary | ICD-10-CM | POA: Diagnosis not present

## 2017-11-15 DIAGNOSIS — M9903 Segmental and somatic dysfunction of lumbar region: Secondary | ICD-10-CM | POA: Diagnosis not present

## 2017-11-15 DIAGNOSIS — M5431 Sciatica, right side: Secondary | ICD-10-CM | POA: Diagnosis not present

## 2017-11-15 DIAGNOSIS — M9905 Segmental and somatic dysfunction of pelvic region: Secondary | ICD-10-CM | POA: Diagnosis not present

## 2017-11-15 DIAGNOSIS — M5416 Radiculopathy, lumbar region: Secondary | ICD-10-CM | POA: Diagnosis not present

## 2017-11-23 DIAGNOSIS — M9905 Segmental and somatic dysfunction of pelvic region: Secondary | ICD-10-CM | POA: Diagnosis not present

## 2017-11-23 DIAGNOSIS — M9903 Segmental and somatic dysfunction of lumbar region: Secondary | ICD-10-CM | POA: Diagnosis not present

## 2017-11-23 DIAGNOSIS — M5431 Sciatica, right side: Secondary | ICD-10-CM | POA: Diagnosis not present

## 2017-11-23 DIAGNOSIS — M5416 Radiculopathy, lumbar region: Secondary | ICD-10-CM | POA: Diagnosis not present

## 2017-11-24 DIAGNOSIS — M9905 Segmental and somatic dysfunction of pelvic region: Secondary | ICD-10-CM | POA: Diagnosis not present

## 2017-11-24 DIAGNOSIS — M5431 Sciatica, right side: Secondary | ICD-10-CM | POA: Diagnosis not present

## 2017-11-24 DIAGNOSIS — M9903 Segmental and somatic dysfunction of lumbar region: Secondary | ICD-10-CM | POA: Diagnosis not present

## 2017-11-24 DIAGNOSIS — M5416 Radiculopathy, lumbar region: Secondary | ICD-10-CM | POA: Diagnosis not present

## 2017-11-30 DIAGNOSIS — M5431 Sciatica, right side: Secondary | ICD-10-CM | POA: Diagnosis not present

## 2017-11-30 DIAGNOSIS — M9905 Segmental and somatic dysfunction of pelvic region: Secondary | ICD-10-CM | POA: Diagnosis not present

## 2017-11-30 DIAGNOSIS — M9903 Segmental and somatic dysfunction of lumbar region: Secondary | ICD-10-CM | POA: Diagnosis not present

## 2017-11-30 DIAGNOSIS — M5416 Radiculopathy, lumbar region: Secondary | ICD-10-CM | POA: Diagnosis not present

## 2017-12-01 DIAGNOSIS — M5416 Radiculopathy, lumbar region: Secondary | ICD-10-CM | POA: Diagnosis not present

## 2017-12-01 DIAGNOSIS — M9905 Segmental and somatic dysfunction of pelvic region: Secondary | ICD-10-CM | POA: Diagnosis not present

## 2017-12-01 DIAGNOSIS — M9903 Segmental and somatic dysfunction of lumbar region: Secondary | ICD-10-CM | POA: Diagnosis not present

## 2017-12-01 DIAGNOSIS — M5431 Sciatica, right side: Secondary | ICD-10-CM | POA: Diagnosis not present

## 2017-12-02 ENCOUNTER — Telehealth: Payer: Self-pay | Admitting: *Deleted

## 2017-12-02 DIAGNOSIS — R072 Precordial pain: Secondary | ICD-10-CM

## 2017-12-02 DIAGNOSIS — E785 Hyperlipidemia, unspecified: Secondary | ICD-10-CM | POA: Insufficient documentation

## 2017-12-02 DIAGNOSIS — R0609 Other forms of dyspnea: Secondary | ICD-10-CM

## 2017-12-02 DIAGNOSIS — R079 Chest pain, unspecified: Secondary | ICD-10-CM | POA: Insufficient documentation

## 2017-12-02 NOTE — Telephone Encounter (Signed)
New order for this pt to get a Coronary CT done has been placed again.  The pts old order expired.  New order placed under original ordering Provider, Tiajuana AmassBrittany Simmons PA-C.  [facility] scheduler Omar PersonSharon Ferguson, to follow-up with the pt and schedule this image.

## 2017-12-07 DIAGNOSIS — M9905 Segmental and somatic dysfunction of pelvic region: Secondary | ICD-10-CM | POA: Diagnosis not present

## 2017-12-07 DIAGNOSIS — F4323 Adjustment disorder with mixed anxiety and depressed mood: Secondary | ICD-10-CM | POA: Diagnosis not present

## 2017-12-07 DIAGNOSIS — M5416 Radiculopathy, lumbar region: Secondary | ICD-10-CM | POA: Diagnosis not present

## 2017-12-07 DIAGNOSIS — M5431 Sciatica, right side: Secondary | ICD-10-CM | POA: Diagnosis not present

## 2017-12-07 DIAGNOSIS — M9903 Segmental and somatic dysfunction of lumbar region: Secondary | ICD-10-CM | POA: Diagnosis not present

## 2017-12-13 DIAGNOSIS — M9903 Segmental and somatic dysfunction of lumbar region: Secondary | ICD-10-CM | POA: Diagnosis not present

## 2017-12-13 DIAGNOSIS — M9905 Segmental and somatic dysfunction of pelvic region: Secondary | ICD-10-CM | POA: Diagnosis not present

## 2017-12-13 DIAGNOSIS — M5431 Sciatica, right side: Secondary | ICD-10-CM | POA: Diagnosis not present

## 2017-12-13 DIAGNOSIS — M5416 Radiculopathy, lumbar region: Secondary | ICD-10-CM | POA: Diagnosis not present

## 2017-12-14 DIAGNOSIS — M1711 Unilateral primary osteoarthritis, right knee: Secondary | ICD-10-CM | POA: Diagnosis not present

## 2018-01-04 DIAGNOSIS — Z6835 Body mass index (BMI) 35.0-35.9, adult: Secondary | ICD-10-CM | POA: Diagnosis not present

## 2018-01-04 DIAGNOSIS — E782 Mixed hyperlipidemia: Secondary | ICD-10-CM | POA: Diagnosis not present

## 2018-01-04 DIAGNOSIS — R7303 Prediabetes: Secondary | ICD-10-CM | POA: Diagnosis not present

## 2018-01-20 DIAGNOSIS — M1711 Unilateral primary osteoarthritis, right knee: Secondary | ICD-10-CM | POA: Diagnosis not present

## 2018-01-21 DIAGNOSIS — M1711 Unilateral primary osteoarthritis, right knee: Secondary | ICD-10-CM | POA: Diagnosis not present

## 2018-02-04 ENCOUNTER — Ambulatory Visit (HOSPITAL_COMMUNITY)
Admission: RE | Admit: 2018-02-04 | Discharge: 2018-02-04 | Disposition: A | Discharge status: Home / Self Care | Payer: BLUE CROSS/BLUE SHIELD | Source: Ambulatory Visit | Attending: Cardiology | Admitting: Cardiology

## 2018-02-04 DIAGNOSIS — R0609 Other forms of dyspnea: Secondary | ICD-10-CM | POA: Insufficient documentation

## 2018-02-04 DIAGNOSIS — E785 Hyperlipidemia, unspecified: Secondary | ICD-10-CM | POA: Insufficient documentation

## 2018-02-04 DIAGNOSIS — R072 Precordial pain: Secondary | ICD-10-CM | POA: Diagnosis not present

## 2018-02-04 LAB — POCT I-STAT CREATININE: Creatinine, Ser: 0.8 mg/dL (ref 0.61–1.24)

## 2018-02-04 MED ORDER — METOPROLOL TARTRATE 5 MG/5ML IV SOLN
INTRAVENOUS | Status: AC
  Filled 2018-02-04: qty 20

## 2018-02-04 MED ORDER — IOPAMIDOL (ISOVUE-370) INJECTION 76%
100.0000 mL | Freq: Once | INTRAVENOUS | Status: AC | PRN
  Administered 2018-02-04: 100 mL via INTRAVENOUS

## 2018-02-04 MED ORDER — NITROGLYCERIN 0.4 MG SL SUBL
SUBLINGUAL_TABLET | SUBLINGUAL | Status: AC
  Filled 2018-02-04: qty 2

## 2018-02-04 MED ORDER — METOPROLOL TARTRATE 5 MG/5ML IV SOLN
5.0000 mg | INTRAVENOUS | Status: DC | PRN
  Administered 2018-02-04: 5 mg via INTRAVENOUS
  Filled 2018-02-04: qty 5

## 2018-02-04 MED ORDER — NITROGLYCERIN 0.4 MG SL SUBL
0.8000 mg | SUBLINGUAL_TABLET | Freq: Once | SUBLINGUAL | Status: AC
  Administered 2018-02-04: 0.8 mg via SUBLINGUAL
  Filled 2018-02-04: qty 25

## 2018-02-04 NOTE — Progress Notes (Signed)
Ct complete. Patient complains of headache. Patient drinking coke at this time.

## 2018-03-05 DIAGNOSIS — M25561 Pain in right knee: Secondary | ICD-10-CM | POA: Diagnosis not present

## 2018-03-05 DIAGNOSIS — M1711 Unilateral primary osteoarthritis, right knee: Secondary | ICD-10-CM | POA: Diagnosis not present

## 2018-03-18 DIAGNOSIS — M1711 Unilateral primary osteoarthritis, right knee: Secondary | ICD-10-CM | POA: Diagnosis not present

## 2018-03-30 DIAGNOSIS — Z5181 Encounter for therapeutic drug level monitoring: Secondary | ICD-10-CM | POA: Diagnosis not present

## 2018-03-30 DIAGNOSIS — Z23 Encounter for immunization: Secondary | ICD-10-CM | POA: Diagnosis not present

## 2018-03-30 DIAGNOSIS — Z125 Encounter for screening for malignant neoplasm of prostate: Secondary | ICD-10-CM | POA: Diagnosis not present

## 2018-03-30 DIAGNOSIS — E782 Mixed hyperlipidemia: Secondary | ICD-10-CM | POA: Diagnosis not present

## 2018-03-30 DIAGNOSIS — Z0000 Encounter for general adult medical examination without abnormal findings: Secondary | ICD-10-CM | POA: Diagnosis not present

## 2018-04-27 IMAGING — CR DG LUMBAR SPINE 2-3V
3 series · 3 of 3 positions shown · non-contrast
Comparison: Lumbar spine three-view series of September 19, 2013

CLINICAL DATA: Chronic low back pain radiating to the left

EXAM:
LUMBAR SPINE - 2-3 VIEW

[t l-spine a.p.]
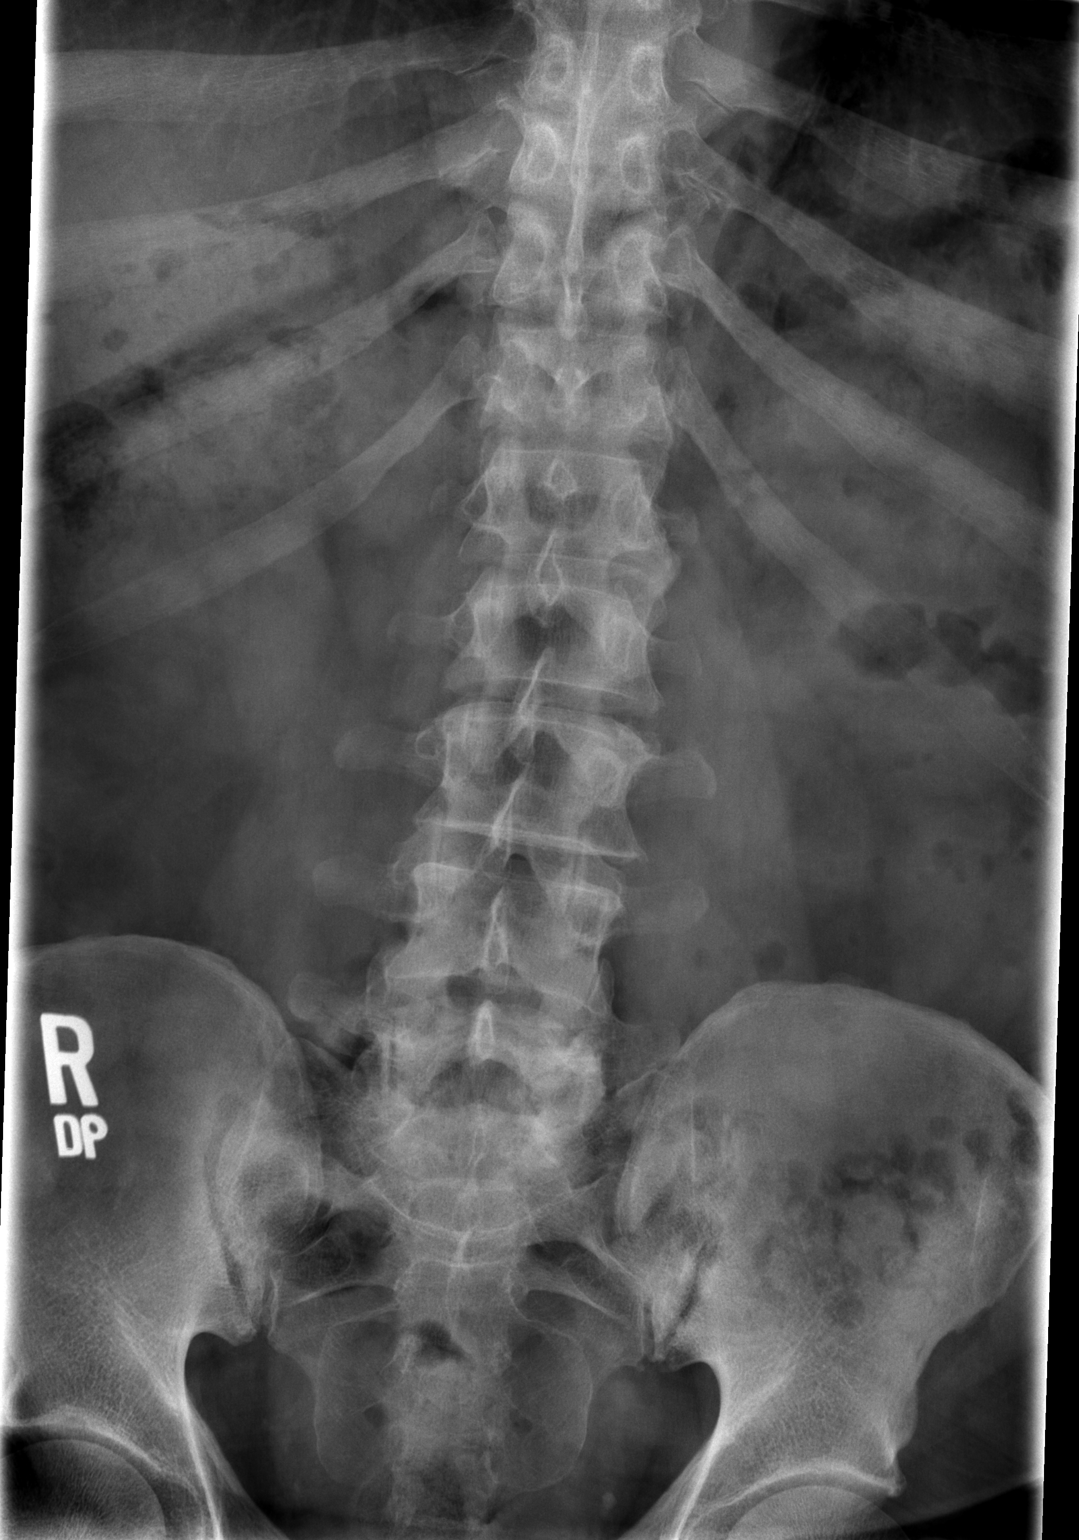

[t l-spine lat]
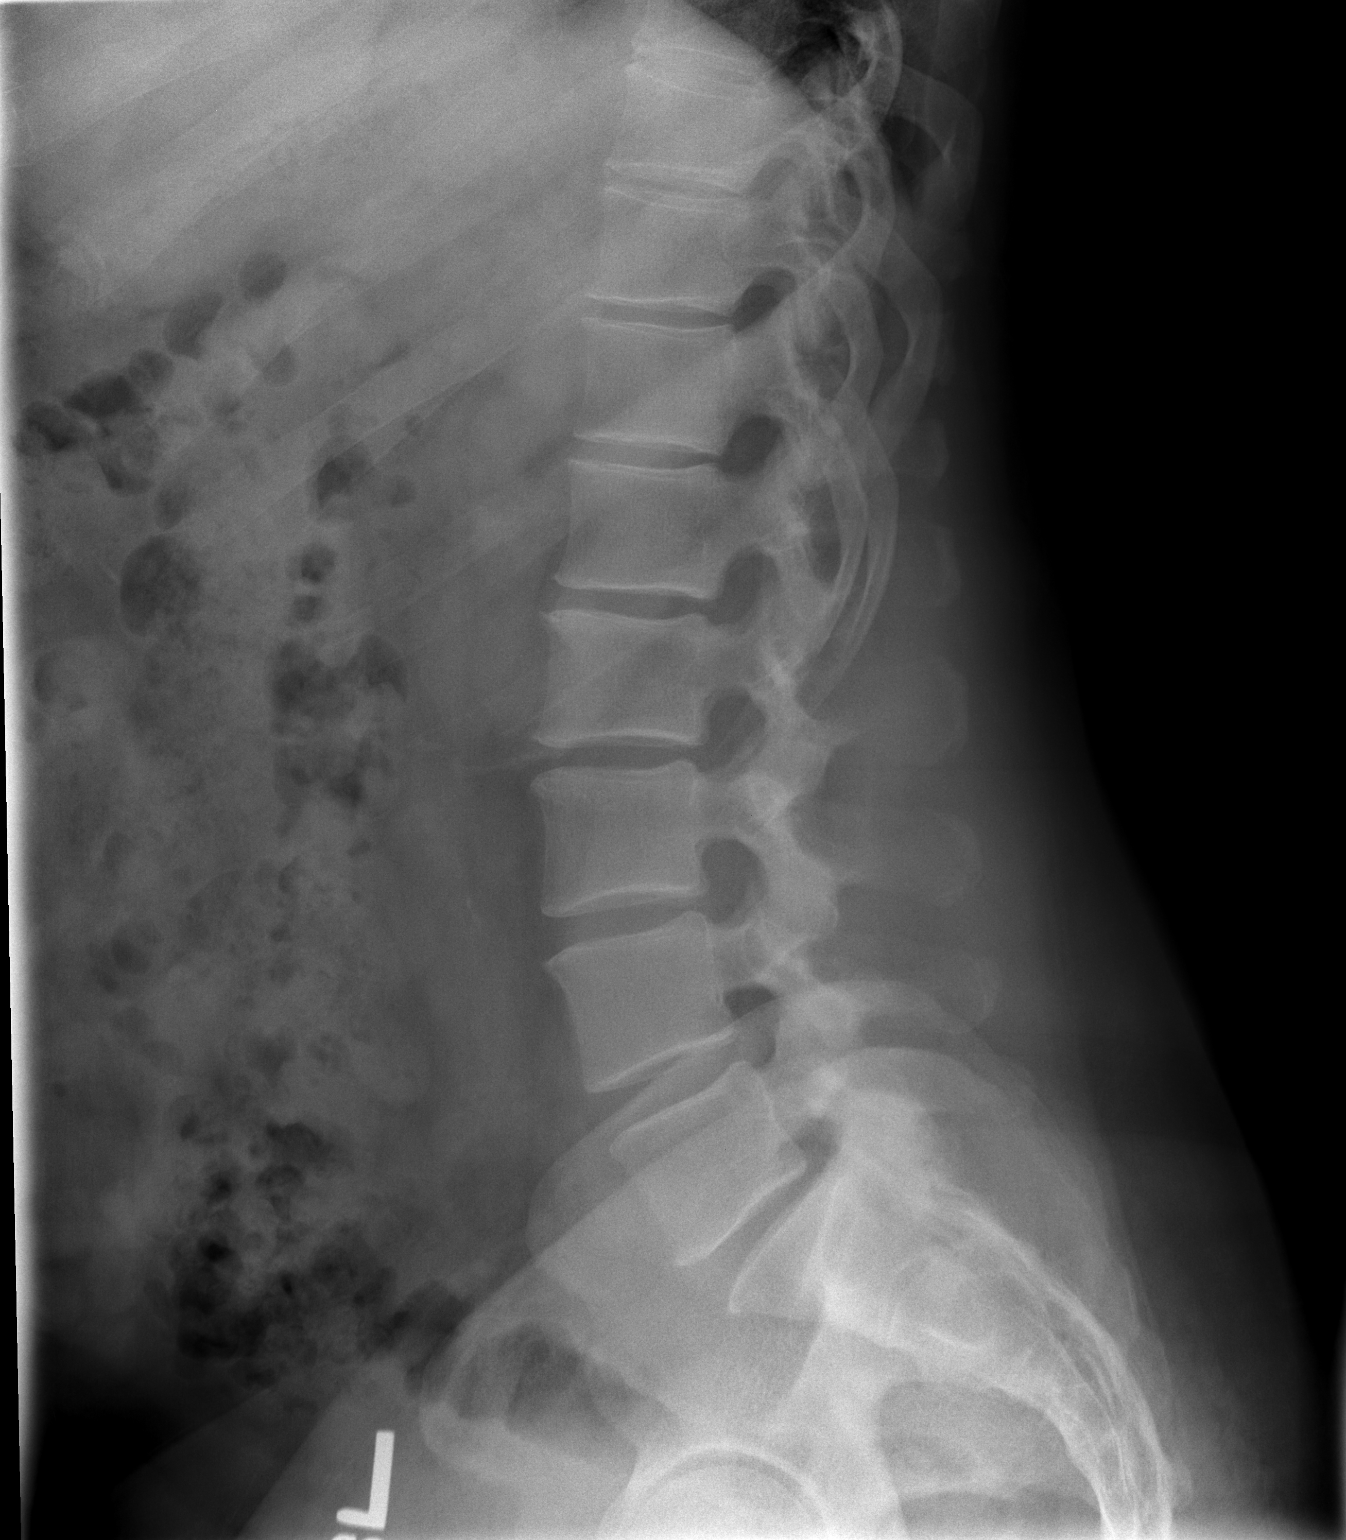

[t l-spine l5-s1 spot]
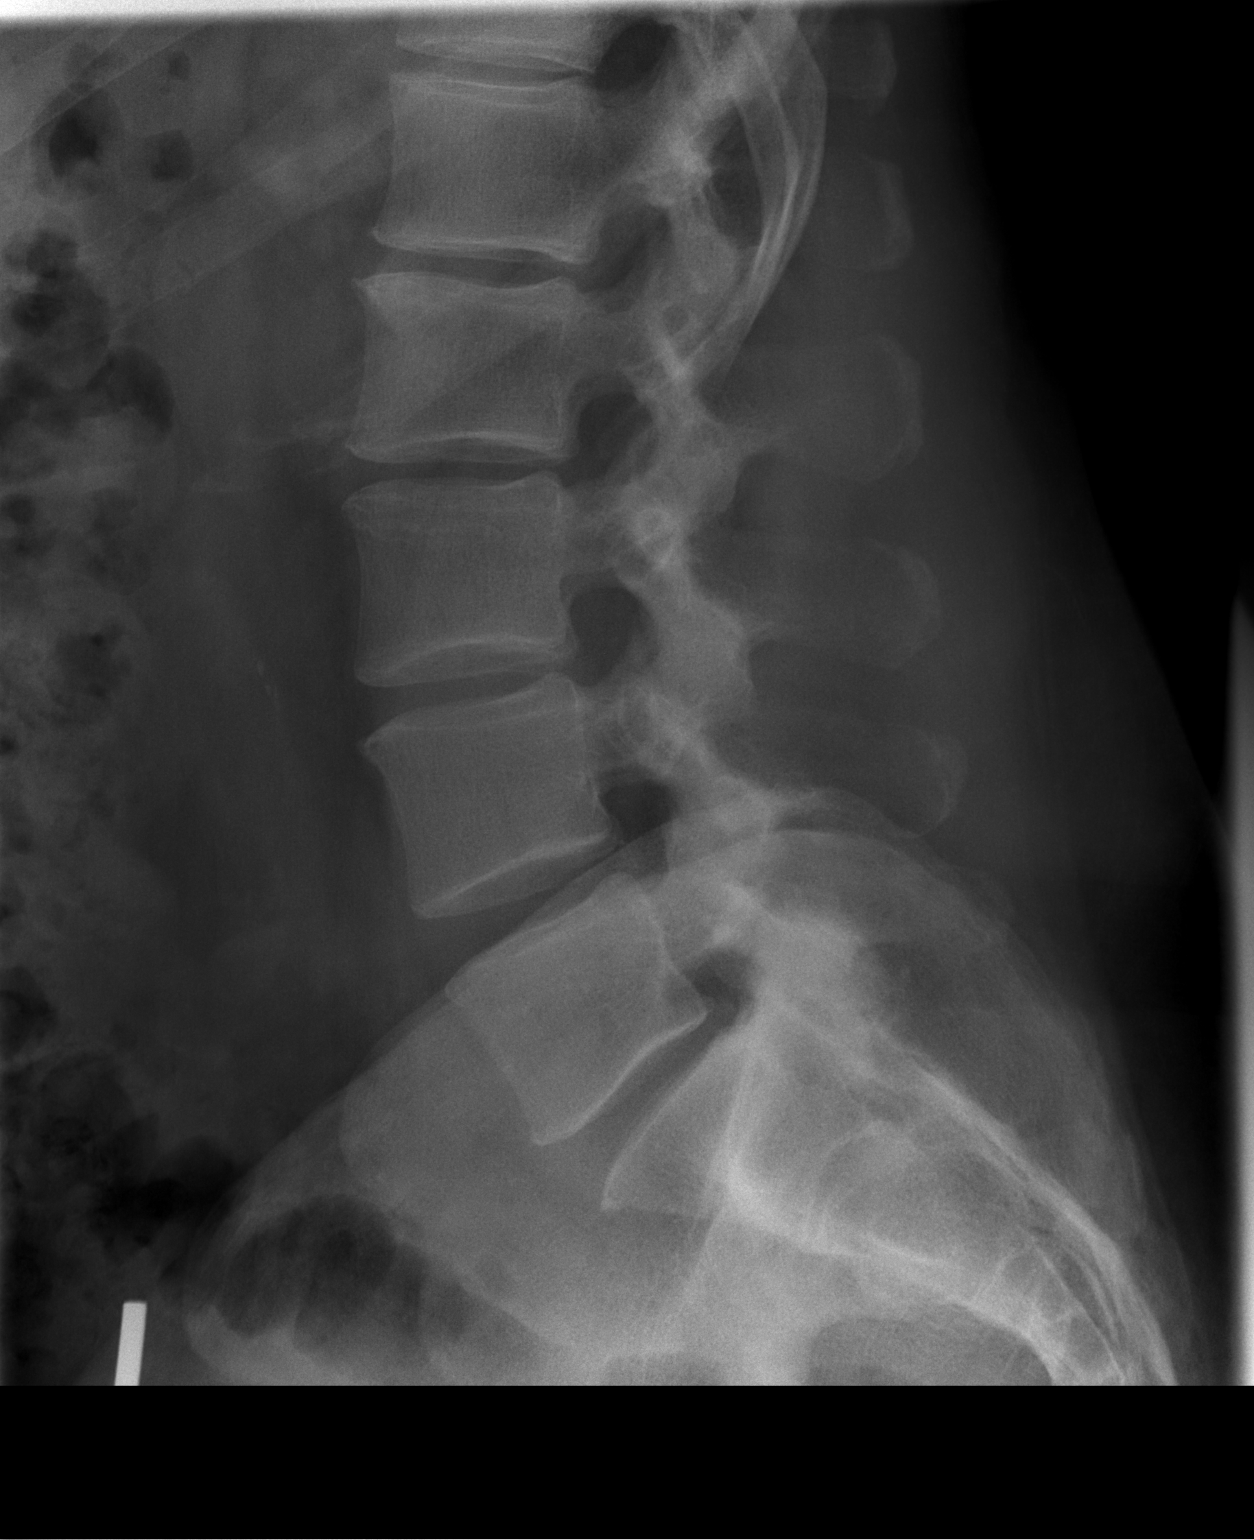

[3 of 3 positions shown; findings below may reference images not displayed]

FINDINGS: The lumbar vertebral bodies are preserved in height. The disc space
heights are well maintained. There is no spondylolisthesis. There is
facet joint hypertrophy at L5-S1 which is stable. The pedicles and
transverse processes are intact. The observed portions of the sacrum
are normal.
IMPRESSION: There is no acute or significant chronic bony abnormality of the
lumbar spine. If the patient has true radicular symptoms, lumbar
spine MRI would be a useful next imaging step.

## 2018-04-28 DIAGNOSIS — F4323 Adjustment disorder with mixed anxiety and depressed mood: Secondary | ICD-10-CM | POA: Diagnosis not present

## 2018-05-15 DIAGNOSIS — S61412A Laceration without foreign body of left hand, initial encounter: Secondary | ICD-10-CM | POA: Diagnosis not present

## 2018-05-24 DIAGNOSIS — F4323 Adjustment disorder with mixed anxiety and depressed mood: Secondary | ICD-10-CM | POA: Diagnosis not present

## 2018-06-02 DIAGNOSIS — M1711 Unilateral primary osteoarthritis, right knee: Secondary | ICD-10-CM | POA: Diagnosis not present

## 2018-08-19 DIAGNOSIS — M1711 Unilateral primary osteoarthritis, right knee: Secondary | ICD-10-CM | POA: Diagnosis not present

## 2018-09-09 DIAGNOSIS — Z01812 Encounter for preprocedural laboratory examination: Secondary | ICD-10-CM | POA: Diagnosis not present

## 2018-09-20 DIAGNOSIS — R0789 Other chest pain: Secondary | ICD-10-CM | POA: Diagnosis not present

## 2018-09-20 DIAGNOSIS — Z5181 Encounter for therapeutic drug level monitoring: Secondary | ICD-10-CM | POA: Diagnosis not present

## 2018-11-25 DIAGNOSIS — Z01812 Encounter for preprocedural laboratory examination: Secondary | ICD-10-CM | POA: Diagnosis not present

## 2018-12-02 DIAGNOSIS — M25562 Pain in left knee: Secondary | ICD-10-CM | POA: Diagnosis not present

## 2018-12-02 DIAGNOSIS — M1711 Unilateral primary osteoarthritis, right knee: Secondary | ICD-10-CM | POA: Diagnosis not present

## 2018-12-08 DIAGNOSIS — M1711 Unilateral primary osteoarthritis, right knee: Secondary | ICD-10-CM | POA: Diagnosis not present

## 2019-02-01 DIAGNOSIS — Z9889 Other specified postprocedural states: Secondary | ICD-10-CM | POA: Diagnosis not present

## 2019-02-16 DIAGNOSIS — F4323 Adjustment disorder with mixed anxiety and depressed mood: Secondary | ICD-10-CM | POA: Diagnosis not present

## 2019-03-17 DIAGNOSIS — S39012A Strain of muscle, fascia and tendon of lower back, initial encounter: Secondary | ICD-10-CM | POA: Diagnosis not present

## 2019-04-04 DIAGNOSIS — F4323 Adjustment disorder with mixed anxiety and depressed mood: Secondary | ICD-10-CM | POA: Diagnosis not present

## 2019-04-28 DIAGNOSIS — E782 Mixed hyperlipidemia: Secondary | ICD-10-CM | POA: Diagnosis not present

## 2019-04-28 DIAGNOSIS — Z5181 Encounter for therapeutic drug level monitoring: Secondary | ICD-10-CM | POA: Diagnosis not present

## 2019-04-28 DIAGNOSIS — Z0000 Encounter for general adult medical examination without abnormal findings: Secondary | ICD-10-CM | POA: Diagnosis not present

## 2019-04-28 DIAGNOSIS — Z125 Encounter for screening for malignant neoplasm of prostate: Secondary | ICD-10-CM | POA: Diagnosis not present

## 2019-04-28 DIAGNOSIS — Z23 Encounter for immunization: Secondary | ICD-10-CM | POA: Diagnosis not present

## 2019-05-01 DIAGNOSIS — J3489 Other specified disorders of nose and nasal sinuses: Secondary | ICD-10-CM | POA: Diagnosis not present

## 2019-05-01 DIAGNOSIS — R05 Cough: Secondary | ICD-10-CM | POA: Diagnosis not present

## 2019-05-02 DIAGNOSIS — R0981 Nasal congestion: Secondary | ICD-10-CM | POA: Diagnosis not present

## 2019-06-09 DIAGNOSIS — K219 Gastro-esophageal reflux disease without esophagitis: Secondary | ICD-10-CM | POA: Diagnosis not present

## 2019-06-12 DIAGNOSIS — F4323 Adjustment disorder with mixed anxiety and depressed mood: Secondary | ICD-10-CM | POA: Diagnosis not present

## 2019-07-03 DIAGNOSIS — Z1159 Encounter for screening for other viral diseases: Secondary | ICD-10-CM | POA: Diagnosis not present

## 2019-07-06 DIAGNOSIS — K449 Diaphragmatic hernia without obstruction or gangrene: Secondary | ICD-10-CM | POA: Diagnosis not present

## 2019-07-06 DIAGNOSIS — K2970 Gastritis, unspecified, without bleeding: Secondary | ICD-10-CM | POA: Diagnosis not present

## 2019-07-06 DIAGNOSIS — K2930 Chronic superficial gastritis without bleeding: Secondary | ICD-10-CM | POA: Diagnosis not present

## 2019-07-06 DIAGNOSIS — R1013 Epigastric pain: Secondary | ICD-10-CM | POA: Diagnosis not present

## 2019-07-06 DIAGNOSIS — K3189 Other diseases of stomach and duodenum: Secondary | ICD-10-CM | POA: Diagnosis not present

## 2019-07-06 DIAGNOSIS — K319 Disease of stomach and duodenum, unspecified: Secondary | ICD-10-CM | POA: Diagnosis not present

## 2019-07-07 DIAGNOSIS — M1711 Unilateral primary osteoarthritis, right knee: Secondary | ICD-10-CM | POA: Diagnosis not present

## 2019-07-07 DIAGNOSIS — Z471 Aftercare following joint replacement surgery: Secondary | ICD-10-CM | POA: Diagnosis not present

## 2019-07-07 DIAGNOSIS — Z96651 Presence of right artificial knee joint: Secondary | ICD-10-CM | POA: Diagnosis not present

## 2019-07-19 ENCOUNTER — Encounter: Payer: Self-pay | Admitting: Physician Assistant

## 2019-07-21 ENCOUNTER — Encounter: Payer: Self-pay | Admitting: Medical

## 2019-07-21 ENCOUNTER — Ambulatory Visit: Payer: BC Managed Care – PPO | Admitting: Medical

## 2019-07-21 ENCOUNTER — Other Ambulatory Visit: Payer: Self-pay

## 2019-07-21 VITALS — BP 150/90 | HR 67 | Ht 66.5 in | Wt 239.8 lb

## 2019-07-21 DIAGNOSIS — I288 Other diseases of pulmonary vessels: Secondary | ICD-10-CM | POA: Diagnosis not present

## 2019-07-21 DIAGNOSIS — R0600 Dyspnea, unspecified: Secondary | ICD-10-CM | POA: Diagnosis not present

## 2019-07-21 DIAGNOSIS — G4733 Obstructive sleep apnea (adult) (pediatric): Secondary | ICD-10-CM

## 2019-07-21 DIAGNOSIS — R072 Precordial pain: Secondary | ICD-10-CM | POA: Diagnosis not present

## 2019-07-21 DIAGNOSIS — R0609 Other forms of dyspnea: Secondary | ICD-10-CM

## 2019-07-21 DIAGNOSIS — I10 Essential (primary) hypertension: Secondary | ICD-10-CM

## 2019-07-21 DIAGNOSIS — R5383 Other fatigue: Secondary | ICD-10-CM

## 2019-07-21 MED ORDER — AMLODIPINE BESYLATE 5 MG PO TABS: 5.0000 mg | ORAL_TABLET | Freq: Every day | ORAL | 3 refills | Status: DC

## 2019-07-21 NOTE — Patient Instructions (Signed)
Medication Instructions:  Your physician has recommended you make the following change in your medication:  1.  START Amlodipine 5 mg taking 1 tablet daily  *If you need a refill on your cardiac medications before your next appointment, please call your pharmacy*  Lab Work: TODAY:  BMET, CBC, & TSH  If you have labs (blood work) drawn today and your tests are completely normal, you will receive your results only by: Marland Kitchen MyChart Message (if you have MyChart) OR . A paper copy in the mail If you have any lab test that is abnormal or we need to change your treatment, we will call you to review the results.  Testing/Procedures: Your physician has requested that you have an echocardiogram. Echocardiography is a painless test that uses sound waves to create images of your heart. It provides your doctor with information about the size and shape of your heart and how well your heart's chambers and valves are working. This procedure takes approximately one hour. There are no restrictions for this procedure.    Follow-Up: At [facility], you and your health needs are our priority.  As part of our continuing mission to provide you with exceptional heart care, we have created designated Provider Care Teams.  These Care Teams include your primary Cardiologist (physician) and Advanced Practice Providers (APPs -  Physician Assistants and Nurse Practitioners) who all work together to provide you with the care you need, when you need it.  Your next appointment:   2 week(s)  08/04/2019 10:45.  Someone will call you around 10:30.  Please have your vital signs at that time to give over the phone.  The format for your next appointment:   Virtual Visit   Provider:   Ronie Spies, PA-C  Other Instructions YOUR CARDIOLOGY TEAM HAS ARRANGED FOR AN E-VISIT FOR YOUR APPOINTMENT - PLEASE REVIEW IMPORTANT INFORMATION BELOW SEVERAL DAYS PRIOR TO YOUR APPOINTMENT  Due to the recent COVID-19 pandemic, we are  transitioning in-person office visits to tele-medicine visits in an effort to decrease unnecessary exposure to our patients, their families, and staff. These visits are billed to your insurance just like a normal visit is. We also encourage you to sign up for MyChart if you have not already done so. You will need a smartphone if possible. For patients that do not have this, we can still complete the visit using a regular telephone but do prefer a smartphone to enable video when possible. You may have a family member that lives with you that can help. If possible, we also ask that you have a blood pressure cuff and scale at home to measure your blood pressure, heart rate and weight prior to your scheduled appointment. Patients with clinical needs that need an in-person evaluation and testing will still be able to come to the office if absolutely necessary. If you have any questions, feel free to call our office.     YOUR PROVIDER WILL BE USING THE FOLLOWING PLATFORM TO COMPLETE YOUR VISIT: /Doxy.Me  . IF USING DOXIMITY or [email or web address].ME - The staff will give you instructions on receiving your link to join the meeting the day of your visit.    THE DAY OF YOUR APPOINTMENT  Approximately 15 minutes prior to your scheduled appointment, you will receive a telephone call from one of [facility] team - your caller ID may say "Unknown caller."  Our staff will confirm medications, vital signs for the day and any symptoms you may be experiencing. Please have this information  available prior to the time of visit start. It may also be helpful for you to have a pad of paper and pen handy for any instructions given during your visit. They will also walk you through joining the smartphone meeting if this is a video visit.    CONSENT FOR TELE-HEALTH VISIT - PLEASE REVIEW  I hereby voluntarily request, consent and authorize [facility] and its employed or contracted physicians, physician assistants, nurse practitioners or  other licensed health care professionals (the Practitioner), to provide me with telemedicine health care services (the "Services") as deemed necessary by the treating Practitioner. I acknowledge and consent to receive the Services by the Practitioner via telemedicine. I understand that the telemedicine visit will involve communicating with the Practitioner through live audiovisual communication technology and the disclosure of certain medical information by electronic transmission. I acknowledge that I have been given the opportunity to request an in-person assessment or other available alternative prior to the telemedicine visit and am voluntarily participating in the telemedicine visit.  I understand that I have the right to withhold or withdraw my consent to the use of telemedicine in the course of my care at any time, without affecting my right to future care or treatment, and that the Practitioner or I may terminate the telemedicine visit at any time. I understand that I have the right to inspect all information obtained and/or recorded in the course of the telemedicine visit and may receive copies of available information for a reasonable fee.  I understand that some of the potential risks of receiving the Services via telemedicine include:  Marland Kitchen Delay or interruption in medical evaluation due to technological equipment failure or disruption; . Information transmitted may not be sufficient (e.g. poor resolution of images) to allow for appropriate medical decision making by the Practitioner; and/or  . In rare instances, security protocols could fail, causing a breach of personal health information.  Furthermore, I acknowledge that it is my responsibility to provide information about my medical history, conditions and care that is complete and accurate to the best of my ability. I acknowledge that Practitioner's advice, recommendations, and/or decision may be based on factors not within their control, such  as incomplete or inaccurate data provided by me or distortions of diagnostic images or specimens that may result from electronic transmissions. I understand that the practice of medicine is not an exact science and that Practitioner makes no warranties or guarantees regarding treatment outcomes. I acknowledge that I will receive a copy of this consent concurrently upon execution via email to the email address I last provided but may also request a printed copy by calling the office of [place].    I understand that my insurance will be billed for this visit.   I have read or had this consent read to me. . I understand the contents of this consent, which adequately explains the benefits and risks of the Services being provided via telemedicine.  . I have been provided ample opportunity to ask questions regarding this consent and the Services and have had my questions answered to my satisfaction. . I give my informed consent for the services to be provided through the use of telemedicine in my medical care  By participating in this telemedicine visit I agree to the above.    Echocardiogram An echocardiogram is a procedure that uses painless sound waves (ultrasound) to produce an image of the heart. Images from an echocardiogram can provide important information about:  Signs of coronary artery  disease (CAD).  Aneurysm detection. An aneurysm is a weak or damaged part of an artery wall that bulges out from the normal force of blood pumping through the body.  Heart size and shape. Changes in the size or shape of the heart can be associated with certain conditions, including heart failure, aneurysm, and CAD.  Heart muscle function.  Heart valve function.  Signs of a past heart attack.  Fluid buildup around the heart.  Thickening of the heart muscle.  A tumor or infectious growth around the heart valves. Tell a health care provider about:  Any allergies you have.  All medicines  you are taking, including vitamins, herbs, eye drops, creams, and over-the-counter medicines.  Any blood disorders you have.  Any surgeries you have had.  Any medical conditions you have.  Whether you are pregnant or may be pregnant. What are the risks? Generally, this is a safe procedure. However, problems may occur, including:  Allergic reaction to dye (contrast) that may be used during the procedure. What happens before the procedure? No specific preparation is needed. You may eat and drink normally. What happens during the procedure?   An IV tube may be inserted into one of your veins.  You may receive contrast through this tube. A contrast is an injection that improves the quality of the pictures from your heart.  A gel will be applied to your chest.  A wand-like tool (transducer) will be moved over your chest. The gel will help to transmit the sound waves from the transducer.  The sound waves will harmlessly bounce off of your heart to allow the heart images to be captured in real-time motion. The images will be recorded on a computer. The procedure may vary among health care providers and hospitals. What happens after the procedure?  You may return to your normal, everyday life, including diet, activities, and medicines, unless your health care provider tells you not to do that. Summary  An echocardiogram is a procedure that uses painless sound waves (ultrasound) to produce an image of the heart.  Images from an echocardiogram can provide important information about the size and shape of your heart, heart muscle function, heart valve function, and fluid buildup around your heart.  You do not need to do anything to prepare before this procedure. You may eat and drink normally.  After the echocardiogram is completed, you may return to your normal, everyday life, unless your health care provider tells you not to do that. This information is not intended to replace advice  given to you by your health care provider. Make sure you discuss any questions you have with your health care provider. Document Revised: 10/06/2018 Document Reviewed: 07/18/2016 Elsevier Patient Education  2020 [employer].

## 2019-07-21 NOTE — Progress Notes (Signed)
Cardiology Office Note  Date:  07/21/2019   ID:  Cody Villegas, DOB Jan 04, 1961, MRN [id number]  PCP:  Shirlean Mylar, MD  Cardiologist:  Dr. Delton See  _____________  Chief Complaint: 2 year follow-up; Chest discomfort  _____________   History of Present Illness: Cody Villegas is a 59 y.o. male with pmh of dyslipidemia, GERD, and obesity who presents today for a 2 year follow-up. The patient was seen in 2017 by Dr. Delton See for exertional dyspnea. 2D echo showed preserved EF and G1DD with no valvular abnormalities. The patient had an ETT that was abnormal. BP demonstrated a hypotensive response in stage 3 (that eventually recovered) with horizontal up-sloping ST segment depression of 1 mm in the inferior leads, returning to baseline after 1-5 minutes. A nuclear stress test was ordered showing a small, moderate intensity, fixed apical defect consistent with thinning; no ischemia, EF 57 with normal wall motion. No further testing was recommended at that time.   The patient was last seen in the office 09/02/2017 complaining of exertional dyspnea and mild chest discomfort walking up steps, which was new for the patient. EKG was unchanged from previous. A coronary CT was ordered showing a calcium score of 0, no evidence of CAD, and dilated pulmonary artery measuring 37 mm suggestive of pulmonary hypertension. Labs at that time showed TG 165, LDL 78. BMP was stable.   Today the patient is here for a 2 year follow-up. Overall he is doing OK. He has been feeling fatigued over the last year, unsure as to why. He feels well-rested about half the nights and he does not sleep during the day. Previous testing for OSA did not qualify patient for CPAP machine. The patient had an EGD last week for persistent GERD on omeprazole. He is awaiting the results.   Patient reports 1 year of dull left-sided chest discomfort, 4/10, that started with no known injury. Pain is intermittent, about once a week and is not worse with  activity. Pain last a couple seconds before gong away. No associated symptoms. Nothing seems to make it better. He also experiences some left arm pain but this is not related to the chest discomfort. Denies shortness of breath.   Patient also reports he is plans to start going to the gym. 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 He plans to do biking since he cannot run with his knees. He has been trying to eat healthier and reports he is down a couple lbs since last week. Weight is up about 10 lbs since his last visit (08/2017). Patient is still working full time. He started taking albuterol as needed, which is felt to be when there is a lot of dust.     Past Medical History:  Diagnosis Date  . Dilatation of pulmonic artery (HCC)    a. by CT 2019.  Marland Kitchen Dyspnea   . Esophageal reflux   . Hx of major depression    resolved per pt 04/2006  . Hypogonadism, male   . Obesity   . OSA (obstructive sleep apnea)   . Prediabetes    Past Surgical History:  Procedure Laterality Date  . NO PAST SURGERIES     _____________  Current Outpatient Medications  Medication Sig Dispense Refill  . albuterol (VENTOLIN HFA) 108 (90 Base) MCG/ACT inhaler as needed.    . fluticasone (FLONASE) 50 MCG/ACT nasal spray Place into both nostrils as needed for allergies or rhinitis.    Marland Kitchen ibuprofen (ADVIL) 200 MG tablet Take 200  mg by mouth every 6 (six) hours as needed.    Marland Kitchen omeprazole (PRILOSEC) 40 MG capsule Take 40 mg by mouth daily as needed.     No current facility-administered medications for this visit.   _____________   Allergies:   Septra [sulfamethoxazole-trimethoprim]  _____________   Social History:  The patient  reports that he has never smoked. He has never used smokeless tobacco. He reports that he does not use drugs.  _____________   Family History:  The patient's family history is not on file. He was adopted.  _____________   ROS:  Please see the history of present illness.   Positive for  intermittent chest discomfort,   All other systems are reviewed and negative.  _____________   PHYSICAL EXAM: VS:  BP (!) 150/90   Pulse 67   Ht 5' 6.5" (1.689 m)   Wt 239 lb 12.8 oz (108.8 kg)   SpO2 95%   BMI 38.12 kg/m  , BMI Body mass index is 38.12 kg/m. GEN: Well nourished, well developed, in no acute distress  HEENT: normal  Neck: no JVD, carotid bruits, or masses Cardiac: RRR; no murmurs, rubs, or gallops. No clubbing, cyanosis, edema.  Radials/DP/PT 2+ and equal bilaterally.  Respiratory:  clear to auscultation bilaterally, normal work of breathing GI: soft, nontender, nondistended, + BS MS: no deformity or atrophy  Skin: warm and dry, no rash Neuro:  Strength and sensation are intact Psych: euthymic mood, full affect _____________  EKG:   The ekg ordered today shows NSR, 67 bpm, TWI aVF, III. Q waves lead III; probable PVC  Recent Labs: No results found for requested labs within last 8760 hours.  No results found for requested labs within last 8760 hours.  CrCl cannot be calculated (Patient's most recent lab result is older than the maximum 21 days allowed.).  Wt Readings from Last 3 Encounters:  07/21/19 239 lb 12.8 oz (108.8 kg)  09/02/17 228 lb (103.4 kg)  09/04/15 231 lb (104.8 kg)    _____________  Cardiac studies: Echo 06/2015 ------------------------------------------------------------------- Study Conclusions  - Left ventricle: The cavity size was mildly dilated. There was   mild focal basal hypertrophy of the septum. Systolic function was   normal. The estimated ejection fraction was in the range of 55%   to 60%. Wall motion was normal; there were no regional wall   motion abnormalities. Doppler parameters are consistent with   abnormal left ventricular relaxation (grade 1 diastolic   dysfunction). - Mitral valve: Redundant sub chordal apparatus. - Right ventricle: The cavity size was mildly dilated. Wall   thickness was normal. - Atrial septum:  No defect or patent foramen ovale was identified  Lexiscan 07/2015  The left ventricular ejection fraction is normal (55-65%).  Nuclear stress EF: 57%.  There was no ST segment deviation noted during stress.  This is a low risk study.   Low risk stress nuclear study with a small, moderate intensity, fixed apical defect consistent with thinning; no ischemia; EF 57 with normal wall motion   Coronary CT 01/2018 1. Coronary calcium score of 0. This was 0 percentile for age and sex matched control.  2. Normal coronary origin with right dominance.  3. No evidence of CAD.  4. Dilated pulmonary artery measuring 37 mm suggestive of pulmonary hypertension   ASSESSMENT AND PLAN:  Precordial Pain Patient reports dull left-sided chest pain , intermittent, about once weekly, no associated symptoms, and not worse on exertion. This started about a year ago.  He thinks it might be MSK pain. Denies worsened DOE. Patient had a Coronary CT 01/2018 showing no CAD and calcium score of 0, which is reassuring. Recent LDL 93 and A1C 5.9 10/2018. EKG in the office is also reassuring. Since patient plans to begin exercising recommended patient monitor if pain is illicited with activity, and to get back to [name]. We will also obtain echocardiogram for history of dilated pulmonary artery. If there are any structural changes will need to consider ischemic evaluation. Suspicion for ischemia is low. Not tachycardic, tachypnic, or hypoxic to suggest PE.   Essential HTN This is a new diagnosis for him. His B/P at [facility] visit was 138/88 and today it was 150/90. Patient does not take his BP at home. EF in 2017 was normal. Will start patient on Amlodipine 5 mg daily. Recommended patient obtain a BP cuff and begin recording his BP daily. Plan to follow-up in 2 weeks for a Virtual visit to monitor response. Will check a BMET if ACE/ARB might possible considered.   Fatigue Patient has been tested for sleep apnea in the past,  but did not qualify for a CPAP. Says he has a restful night about half of the time. He is not sleeping during the day, but feels like he wants to. Will check TSH and CBC  Obesity Weight is up about 10 lbs since the last visit 08/2017. Patient plans to start going to the gym. 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 Also plans to adjust his diet. Says he has lost a couple pounds since last week. Discussed obesity clinic, but patient would like to wait at this time.   Dilation of Pulmonary artery Noted on Coronary CT 01/2018. Will order echo to further assess for pulmonary hypertension.  Dyslipidemia Recent LDL 93, TG 142. Not on medication at baseline. Continue lifestyle changes, as above.   Disposition:   FU with APP, Virtual Visit in 2 weeks   Signed, Jennilee Demarco David Stall, PA-C 07/21/2019 12:34 PM    _____________ [facility] [address] [place] [address]  [phone] (office) [phone] (fax)

## 2019-07-22 LAB — CBC
Hematocrit: 46.8 % (ref 37.5–51.0)
Hemoglobin: 15.6 g/dL (ref 13.0–17.7)
MCH: 30.8 pg (ref 26.6–33.0)
MCHC: 33.3 g/dL (ref 31.5–35.7)
MCV: 92 fL (ref 79–97)
Platelets: 304 10*3/uL (ref 150–450)
RBC: 5.07 x10E6/uL (ref 4.14–5.80)
RDW: 12.8 % (ref 11.6–15.4)
WBC: 6.9 10*3/uL (ref 3.4–10.8)

## 2019-07-22 LAB — BASIC METABOLIC PANEL
BUN/Creatinine Ratio: 21 — ABNORMAL HIGH (ref 9–20)
BUN: 18 mg/dL (ref 6–24)
CO2: 24 mmol/L (ref 20–29)
Calcium: 8.9 mg/dL (ref 8.7–10.2)
Chloride: 103 mmol/L (ref 96–106)
Creatinine, Ser: 0.85 mg/dL (ref 0.76–1.27)
GFR calc Af Amer: 112 mL/min/{1.73_m2} (ref >59)
GFR calc non Af Amer: 97 mL/min/{1.73_m2} (ref >59)
Glucose: 96 mg/dL (ref 65–99)
Potassium: 4.2 mmol/L (ref 3.5–5.2)
Sodium: 141 mmol/L (ref 134–144)

## 2019-07-22 LAB — TSH: TSH: 1.99 u[IU]/mL (ref 0.450–4.500)

## 2019-07-28 ENCOUNTER — Other Ambulatory Visit: Payer: Self-pay

## 2019-07-28 ENCOUNTER — Ambulatory Visit (HOSPITAL_COMMUNITY): Payer: BC Managed Care – PPO | Attending: Cardiology

## 2019-07-28 DIAGNOSIS — I288 Other diseases of pulmonary vessels: Secondary | ICD-10-CM | POA: Diagnosis not present

## 2019-08-02 ENCOUNTER — Telehealth: Payer: Self-pay | Admitting: Medical

## 2019-08-02 NOTE — Telephone Encounter (Signed)
New message   Patient is returning call for echo results. Please call. 

## 2019-08-02 NOTE — Telephone Encounter (Signed)
Pt verbalized understanding fo his Echo results and will keep his virtual visit with Ronie Spies PA 08/07/19.

## 2019-08-04 ENCOUNTER — Ambulatory Visit: Payer: BLUE CROSS/BLUE SHIELD | Admitting: Physician Assistant

## 2019-08-04 ENCOUNTER — Telehealth: Payer: BC Managed Care – PPO | Admitting: Physician Assistant

## 2019-08-06 NOTE — Progress Notes (Deleted)
Cardiology Office Note  Date: 08/06/2019   ID: Gurveer Colucci, DOB 03-03-1961, MRN [id number]  PCP:  Maurice Small, MD  Cardiologist:  Ena Dawley, MD Electrophysiologist:  None   No chief complaint on file.   History of Present Illness: Marsha Hillman is a 59 y.o. male with past medical history of HLD, GERD, obesity recently seen for 2-year follow-up for history of chest discomfort on July 21, 2019.  Seen previously by Dr. Meda Coffee for exertional dyspnea.  2D echo showed preserved EF and grade 1 diastolic dysfunction with no valvular abnormalities.  Exercise treadmill stress test is abnormal.  Patient became hypotensive in stage III of Bruce protocol and eventually recovered with horizontal sloping ST segment depression of 1 mm in the inferior leads returning to baseline after 5-minutes.  Nuclear stress test was ordered showing a small moderate intensity fixed apical defect consistent with thinning.  No ischemia, EF 57% with normal wall motion.  Previous visit had been on September 02, 2017 for complaint of exertional dyspnea and mild chest discomfort when walking up steps.  EKG was unchanged from previous visit.  Coronary CT showed a calcium score of 0, no evidence of coronary artery disease.  Dilated pulmonary artery measuring 37 mm suggestive of pulmonary hypertension.  Patient had a recent EGD at last visit but results were pending.  He was having significant reflux symptoms in spite of being on omeprazole.   . Past Medical History:  Diagnosis Date  . Abnormality of right ventricle of heart   . Ascending aorta dilatation (HCC)   . Dilatation of pulmonic artery ([place])    a. by CT 2019.  Marland Kitchen Dyspnea   . Esophageal reflux   . Essential hypertension   . Hx of major depression    resolved per pt 04/2006  . Hypogonadism, male   . Obesity   . OSA (obstructive sleep apnea)   . Prediabetes     Past Surgical History:  Procedure Laterality Date  . NO PAST SURGERIES      Current  Outpatient Medications  Medication Sig Dispense Refill  . albuterol (VENTOLIN HFA) 108 (90 Base) MCG/ACT inhaler as needed.    Marland Kitchen amLODipine (NORVASC) 5 MG tablet Take 1 tablet (5 mg total) by mouth daily. 90 tablet 3  . fluticasone (FLONASE) 50 MCG/ACT nasal spray Place into both nostrils as needed for allergies or rhinitis.    Marland Kitchen ibuprofen (ADVIL) 200 MG tablet Take 200 mg by mouth every 6 (six) hours as needed.    Marland Kitchen omeprazole (PRILOSEC) 40 MG capsule Take 40 mg by mouth daily as needed.     No current facility-administered medications for this visit.   Allergies:  Septra [sulfamethoxazole-trimethoprim]   Social History: The patient  reports that he has never smoked. He has never used smokeless tobacco. He reports that he does not drink alcohol or use drugs.   Family History: The patient's family history is not on file. He was adopted.   ROS:  Please see the history of present illness. Otherwise, complete review of systems is positive for none.  All other systems are reviewed and negative.   Physical Exam: VS:  There were no vitals taken for this visit., BMI There is no height or weight on file to calculate BMI.  Wt Readings from Last 3 Encounters:  07/21/19 239 lb 12.8 oz (108.8 kg)  09/02/17 228 lb (103.4 kg)  09/04/15 231 lb (104.8 kg)    General: Patient appears comfortable at rest. HEENT: Conjunctiva  and lids normal, oropharynx clear with moist mucosa. Neck: Supple, no elevated JVP or carotid bruits, no thyromegaly. Lungs: Clear to auscultation, nonlabored breathing at rest. Cardiac: Regular rate and rhythm, no S3 or significant systolic murmur, no pericardial rub. Abdomen: Soft, nontender, no hepatomegaly, bowel sounds present, no guarding or rebound. Extremities: No pitting edema, distal pulses 2+. Skin: Warm and dry. Musculoskeletal: No kyphosis. Neuropsychiatric: Alert and oriented x3, affect grossly appropriate.  ECG:  An ECG dated 07/24/2019 was personally reviewed  today and demonstrated:  Normal sinus rhythm rate of 67 T wave inversions and 3 and aVF, 1 PVC  Recent Labwork: 07/21/2019: BUN 18; Creatinine, Ser 0.85; Hemoglobin 15.6; Platelets 304; Potassium 4.2; Sodium 141; TSH 1.990     Component Value Date/Time   CHOL 157 09/09/2017 1106   TRIG 104 09/09/2017 1106   HDL 48 09/09/2017 1106   CHOLHDL 3.3 09/09/2017 1106   CHOLHDL 3.8 08/15/2015 0907   VLDL 33 (H) 08/15/2015 0907   LDLCALC 88 09/09/2017 1106    Other Studies Reviewed Today:  Echocardiogram July 28, 2019 1. Left ventricular ejection fraction, by visual estimation, is 60 to 65%. The left ventricle has normal function. There is no left ventricular hypertrophy. 2. The left ventricle has no regional wall motion abnormalities. 3. Global right ventricle has mildly reduced systolic function.The right ventricular size is mildly enlarged. Right vetricular wall thickness was not assessed. 4. Left atrial size was normal. 5. Right atrial size was mildly dilated. 6. The mitral valve is normal in structure. Trivial mitral valve regurgitation. 7. The tricuspid valve is normal in structure. 8. The tricuspid valve is normal in structure. Tricuspid valve regurgitation is trivial. 9. The aortic valve is tricuspid. Aortic valve regurgitation is not visualized. No evidence of aortic valve sclerosis or stenosis. 10. The pulmonic valve was normal in structure. Pulmonic valve regurgitation is trivial. 11. Aortic dilatation noted. 12. There is moderate dilatation of the ascending aorta measuring 45 mm. 13. TAA unchanged size from 2017. 14. Mildly dilated pulmonary artery. 15. Normal pulmonary artery systolic pressure. 16. Best view of PA shows size of 3.5 cm. 17. The atrial septum is grossly normal.  Cardiac/coronary CT 02/04/2018 IMPRESSION: 1. Coronary calcium score of 0. This was 0 percentile for age and sex matched control.  2. Normal coronary origin with right dominance.  3. No  evidence of CAD.  4. Dilated pulmonary artery measuring 37 mm suggestive of pulmonary hypertension.   Assessment and Plan:  1. Precordial pain   2. Benign essential HTN   3. Fatigue, unspecified type   4. Hyperlipidemia, unspecified hyperlipidemia type   5. Dilatation of pulmonic artery (HCC)      Medication Adjustments/Labs and Tests Ordered: Current medicines are reviewed at length with the patient today.  Concerns regarding medicines are outlined above.    There are no Patient Instructions on file for this visit.       Signed, Rennis Harding, NP 08/06/2019 5:19 PM    [facility] at [facility] [address] [place], [place], [place] [address] Phone: [phone]; Fax: [phone]

## 2019-08-07 ENCOUNTER — Other Ambulatory Visit: Payer: Self-pay

## 2019-08-07 ENCOUNTER — Encounter: Payer: Self-pay | Admitting: Physician Assistant

## 2019-08-07 ENCOUNTER — Telehealth (INDEPENDENT_AMBULATORY_CARE_PROVIDER_SITE_OTHER): Payer: BC Managed Care – PPO | Admitting: Family Medicine

## 2019-08-07 VITALS — BP 149/86 | Ht 66.5 in | Wt 239.8 lb

## 2019-08-07 DIAGNOSIS — R5383 Other fatigue: Secondary | ICD-10-CM

## 2019-08-07 DIAGNOSIS — R072 Precordial pain: Secondary | ICD-10-CM | POA: Diagnosis not present

## 2019-08-07 DIAGNOSIS — I10 Essential (primary) hypertension: Secondary | ICD-10-CM | POA: Diagnosis not present

## 2019-08-07 DIAGNOSIS — I288 Other diseases of pulmonary vessels: Secondary | ICD-10-CM | POA: Diagnosis not present

## 2019-08-07 DIAGNOSIS — E782 Mixed hyperlipidemia: Secondary | ICD-10-CM

## 2019-08-07 DIAGNOSIS — E785 Hyperlipidemia, unspecified: Secondary | ICD-10-CM

## 2019-08-07 DIAGNOSIS — I77810 Thoracic aortic ectasia: Secondary | ICD-10-CM

## 2019-08-07 MED ORDER — AMLODIPINE BESYLATE 10 MG PO TABS: 10.0000 mg | ORAL_TABLET | Freq: Every day | ORAL | 3 refills | Status: DC

## 2019-08-07 NOTE — Addendum Note (Signed)
Addended by: [name] on: 08/07/2019 03:24 PM   Modules accepted: Orders

## 2019-08-07 NOTE — Progress Notes (Signed)
I agree with this, thank you

## 2019-08-07 NOTE — Patient Instructions (Signed)
Medication Instructions:  Your physician has recommended you make the following change in your medication:  1.  INCREASE the Amlodipine to 10 mg taking 1 daily  *If you need a refill on your cardiac medications before your next appointment, please call your pharmacy*  Lab Work: None ordered  If you have labs (blood work) drawn today and your tests are completely normal, you will receive your results only by: Marland Kitchen MyChart Message (if you have MyChart) OR . A paper copy in the mail If you have any lab test that is abnormal or we need to change your treatment, we will call you to review the results.  Testing/Procedures: Non-Cardiac CT Angiography (CTA), is a special type of CT scan that uses a computer to produce multi-dimensional views of major blood vessels throughout the body. In CT angiography, a contrast material is injected through an IV to help visualize the blood vessels  You have been referred to [facility] Pulmonology for repeat sleep study.  They will contact you with an appointment.  .  Follow-Up: At [facility], you and your health needs are our priority.  As part of our continuing mission to provide you with exceptional heart care, we have created designated Provider Care Teams.  These Care Teams include your primary Cardiologist (physician) and Advanced Practice Providers (APPs -  Physician Assistants and Nurse Practitioners) who all work together to provide you with the care you need, when you need it.  Your next appointment:   2  month(s)    10/11/2019 8:45.  Someone will call you for your vital signs and to go over your information like we did this morning.  The format for your next appointment:   Virtual  Provider:   You may see Tobias Alexander, MD  or one of the following Advanced Practice Providers on your designated Care Team:    Ronie Spies, PA-C  Jacolyn Reedy, PA-C   Other Instructions Please check your BP at least once daily with higher dose amlodipine and cal  the office and report your readings to our triage department for further directions.

## 2019-08-07 NOTE — Progress Notes (Addendum)
Virtual Visit via Telephone Note   This visit type was conducted due to national recommendations for restrictions regarding the COVID-19 Pandemic (e.g. social distancing) in an effort to limit this patient's exposure and mitigate transmission in our community.  Due to his co-morbid illnesses, this patient is at least at moderate risk for complications without adequate follow up.  This format is felt to be most appropriate for this patient at this time.  The patient did not have access to video technology/had technical difficulties with video requiring transitioning to audio format only (telephone).  All issues noted in this document were discussed and addressed.  No physical exam could be performed with this format.  Please refer to the patient's chart for his  consent to telehealth for [facility].   Date:  08/07/2019   ID:  Cody Villegas, DOB 21-Aug-1960, MRN [id number]  Patient Location: Home Provider Location: Office  PCP:  Shirlean Mylar, MD  Cardiologist:  Tobias Alexander, MD  Electrophysiologist:  None   Evaluation Performed:  Follow-Up Visit  Chief Complaint:  Follow up Dyspnea mild chest discomfort  History of Present Illness:    Cody Villegas is a 59 y.o. male with past medical history of HLD, GERD, obesity recently seen for 2-year follow-up for and recent history of chest discomfort on July 21, 2019.  Seen previously by Dr. Delton See for exertional dyspnea.  2D echo showed preserved EF and grade 1 diastolic dysfunction with no valvular abnormalities.  Exercise treadmill stress test is abnormal.  Patient became hypotensive in stage III of Bruce protocol and eventually recovered with horizontal sloping ST segment depression of 1 mm in the inferior leads returning to baseline after 5-minutes.  Nuclear stress test was ordered showing a small moderate intensity fixed apical defect consistent with thinning.  No ischemia, EF 57% with normal wall motion.  Previous visit had been on September 02, 2017 for complaint of exertional dyspnea and mild chest discomfort when walking up steps.  EKG was unchanged from previous visit.  Coronary CT showed a calcium score of 0, no evidence of coronary artery disease.  Dilated pulmonary artery measuring 37 mm suggestive of pulmonary hypertension.  Seen January 2021 for dyspnea and chest discomfort. Recent echocardiogram results showed an EF of 60 to 65%, mildly reduced RV function and mildly enlarged RV, mild RAE, mildly dilated pulmonary artery but normal PASP. Thoracic aortic dilation was unchanged from 2017. He was started on amlodipine for elevated BP.  Patient had a recent EGD at last visit but results were pending.  He was having significant reflux symptoms in spite of being on omeprazole.  He denies any progressive anginal or exertional symptoms, palpitations or arrhythmias, orthostatic symptoms.  States his blood pressures are remaining in the 140s to 150s systolic after starting amlodipine 5 mg.  The patient does not have symptoms concerning for COVID-19 infection (fever, chills, cough, or new shortness of breath).    Past Medical History:  Diagnosis Date  . Abnormality of right ventricle of heart   . Ascending aorta dilatation (HCC)   . Dilatation of pulmonic artery (HCC)    a. by CT 2019.  Marland Kitchen Dyspnea   . Esophageal reflux   . Essential hypertension   . Hx of major depression    resolved per pt 04/2006  . Hypogonadism, male   . Obesity   . OSA (obstructive sleep apnea)   . Prediabetes    Past Surgical History:  Procedure Laterality Date  . NO PAST SURGERIES  No outpatient medications have been marked as taking for the 08/07/19 encounter (Telemedicine) with Verta Ellen., NP.     Allergies:   Septra [sulfamethoxazole-trimethoprim]   Social History   Tobacco Use  . Smoking status: Never Smoker  . Smokeless tobacco: Never Used  Substance Use Topics  . Alcohol use: Never    Alcohol/week: 0.0 standard drinks  .  Drug use: No     Family Hx: The patient's family history is not on file. He was adopted.  ROS:   Please see the history of present illness.    All other systems reviewed and are negative.   Prior CV studies:   The following studies were reviewed today:  Echocardiogram July 28, 2019 1. Left ventricular ejection fraction, by visual estimation, is 60 to 65%. The left ventricle has normal function. There is no left ventricular hypertrophy. 2. The left ventricle has no regional wall motion abnormalities. 3. Global right ventricle has mildly reduced systolic function.The right ventricular size is mildly enlarged. Right vetricular wall thickness was not assessed. 4. Left atrial size was normal. 5. Right atrial size was mildly dilated. 6. The mitral valve is normal in structure. Trivial mitral valve regurgitation. 7. The tricuspid valve is normal in structure. 8. The tricuspid valve is normal in structure. Tricuspid valve regurgitation is trivial. 9. The aortic valve is tricuspid. Aortic valve regurgitation is not visualized. No evidence of aortic valve sclerosis or stenosis. 10. The pulmonic valve was normal in structure. Pulmonic valve regurgitation is trivial. 11. Aortic dilatation noted. 12. There is moderate dilatation of the ascending aorta measuring 45 mm. 13. TAA unchanged size from 2017. 14. Mildly dilated pulmonary artery. 15. Normal pulmonary artery systolic pressure. 16. Best view of PA shows size of 3.5 cm. 17. The atrial septum is grossly normal.  Cardiac/coronary CT 02/04/2018 IMPRESSION: 1. Coronary calcium score of 0. This was 0 percentile for age and sex matched control. 2. Normal coronary origin with right dominance. 3. No evidence of CAD. 4. Dilated pulmonary artery measuring 37 mm suggestive of pulmonary hypertension.   Labs/Other Tests and Data Reviewed:    EKG:  An ECG dated 07/24/2019 was personally reviewed today and demonstrated:  Normal sinus  rhythm rate of 67 T wave inversions and 3 and aVF, 1 PVC   Recent Labs: 07/21/2019: BUN 18; Creatinine, Ser 0.85; Hemoglobin 15.6; Platelets 304; Potassium 4.2; Sodium 141; TSH 1.990   Recent Lipid Panel Lab Results  Component Value Date/Time   CHOL 157 09/09/2017 11:06 AM   TRIG 104 09/09/2017 11:06 AM   HDL 48 09/09/2017 11:06 AM   CHOLHDL 3.3 09/09/2017 11:06 AM   CHOLHDL 3.8 08/15/2015 09:07 AM   LDLCALC 88 09/09/2017 11:06 AM    Wt Readings from Last 3 Encounters:  08/07/19 239 lb 12.8 oz (108.8 kg)  07/21/19 239 lb 12.8 oz (108.8 kg)  09/02/17 228 lb (103.4 kg)     Objective:    Vital Signs:  BP (!) 149/86   Ht 5' 6.5" (1.689 m)   Wt 239 lb 12.8 oz (108.8 kg)   BMI 38.12 kg/m    VITAL SIGNS:  reviewed GEN:  no acute distress RESPIRATORY:  No wheezing noted NEURO:  alert and oriented x 3, no obvious focal deficit  ASSESSMENT & PLAN:     1. Precordial pain   2. Benign essential HTN   3. Fatigue, unspecified type   4. Hyperlipidemia, unspecified hyperlipidemia type   5. Dilatation of pulmonic artery (HCC)  1. Precordial pain Denies any more recent progressive anginal or exertional symptoms since last visit.  Advised patient to call with any new symptoms.  Patient had a previous coronary CT scan which had 0 calcium score in 2019  2. Benign essential HTN Patient states systolic blood pressures continue to run in the 140s to 150s after starting amlodipine.  Increase amlodipine to 10 mg daily.  Monitor blood pressures and call if blood pressures remain elevated after increasing dosage of Amlodipine. Next step would be consideration of ARB. 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  3. Fatigue, unspecified type Denies any recent fatigue-like symptoms. States he occasionally feels tired but nothing that is chronic in nature. Previous sleep study but CPAP was not indicated. Refer patient to pulmonology for evaluation of fatigue,  snoring symptoms, dilated PA but normal PASP by echo.  4. Hyperlipidemia, unspecified hyperlipidemia type Last lipid profile was normal. Follow up with PCP   5. Dilatation of pulmonic artery (HCC) Refer to pulmonology for dilated PA, fatigue, RV dysfunction, snoring symptoms. May need PFTs, although not overtly describing any further significant SOB.  6. Dilation of thoracic aorta  Get CTA of chest for assessment of thoracic dilation. This was moderate by echocardiograms, but interestingly coronary CT noted normal aorta. 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  COVID-19 Education: The signs and symptoms of COVID-19 were discussed with the patient and how to seek care for testing (follow up with PCP or arrange E-visit).  The importance of social distancing was discussed today.  Time:   Today, I have spent 15 minutes with the patient with telehealth technology discussing the above problems.     Medication Adjustments/Labs and Tests Ordered: Current medicines are reviewed at length with the patient today.  Concerns regarding medicines are outlined above.   Tests Ordered: Orders Placed This Encounter  Procedures  . CT ANGIO CHEST AORTA W/CM & OR WO/CM  . Ambulatory referral to Pulmonology    Medication Changes: Meds ordered this encounter  Medications  . amLODipine (NORVASC) 10 MG tablet    Sig: Take 1 tablet (10 mg total) by mouth daily.    Dispense:  180 tablet    Refill:  3    Follow Up:  Virtual Visit  in 2 month(s) Dr. Delton See or APP  Signed, Netta Neat, NP  08/07/2019 3:11 PM    [facility]

## 2019-08-18 ENCOUNTER — Ambulatory Visit (HOSPITAL_COMMUNITY): Payer: BC Managed Care – PPO

## 2019-08-23 ENCOUNTER — Ambulatory Visit: Payer: BC Managed Care – PPO | Admitting: Pulmonary Disease

## 2019-08-23 ENCOUNTER — Encounter: Payer: Self-pay | Admitting: Pulmonary Disease

## 2019-08-23 ENCOUNTER — Other Ambulatory Visit: Payer: Self-pay

## 2019-08-23 VITALS — BP 120/74 | HR 80 | Temp 97.3°F | Ht 66.5 in | Wt 236.6 lb

## 2019-08-23 DIAGNOSIS — G4733 Obstructive sleep apnea (adult) (pediatric): Secondary | ICD-10-CM | POA: Diagnosis not present

## 2019-08-23 NOTE — Progress Notes (Signed)
Subjective:    Patient ID: Cody Villegas, male    DOB: 09-Mar-1961, 59 y.o.   MRN: [id number]  Patient is being seen for possible obstructive sleep apnea  History of snoring, no witnessed apneas No dryness of his mouth in the morning No morning headaches States his memory is good  Goes to bed about 8 30-9 30 Falls asleep soon after Usually wakes up maybe once at night to use the bathroom  Final wake up time between 4 and 430  Has gained about 30 pounds recently  States he is able to function well during the day without significant sleepiness  No family history known to him  Past Medical History:  Diagnosis Date  . Abnormality of right ventricle of heart   . Ascending aorta dilatation (HCC)   . Dilatation of pulmonic artery (HCC)    a. by CT 2019.  Marland Kitchen Dyspnea   . Esophageal reflux   . Essential hypertension   . Hx of major depression    resolved per pt 04/2006  . Hypogonadism, male   . Obesity   . OSA (obstructive sleep apnea)   . Prediabetes     Social History   Socioeconomic History  . Marital status: Married    Spouse name: Not on file  . Number of children: Not on file  . Years of education: Not on file  . Highest education level: Not on file  Occupational History  . Not on file  Tobacco Use  . Smoking status: Never Smoker  . Smokeless tobacco: Never Used  Substance and Sexual Activity  . Alcohol use: Never    Alcohol/week: 0.0 standard drinks  . Drug use: No  . Sexual activity: Not on file  Other Topics Concern  . Not on file  Social History Narrative  . Not on file   Social Determinants of Health   Financial Resource Strain:   . Difficulty of Paying Living Expenses: Not on file  Food Insecurity:   . Worried About Programme researcher, broadcasting/film/video in the Last Year: Not on file  . Ran Out of Food in the Last Year: Not on file  Transportation Needs:   . Lack of Transportation (Medical): Not on file  . Lack of Transportation (Non-Medical): Not on file   Physical Activity:   . Days of Exercise per Week: Not on file  . Minutes of Exercise per Session: Not on file  Stress:   . Feeling of Stress : Not on file  Social Connections:   . Frequency of Communication with Friends and Family: Not on file  . Frequency of Social Gatherings with Friends and Family: Not on file  . Attends Religious Services: Not on file  . Active Member of Clubs or Organizations: Not on file  . Attends Banker Meetings: Not on file  . Marital Status: Not on file  Intimate Partner Violence:   . Fear of Current or Ex-Partner: Not on file  . Emotionally Abused: Not on file  . Physically Abused: Not on file  . Sexually Abused: Not on file   Family history-he is adopted  Review of Systems  Constitutional: Negative for fever and unexpected weight change.  HENT: Negative for congestion, dental problem, ear pain, nosebleeds, postnasal drip, rhinorrhea, sinus pressure, sneezing, sore throat and trouble swallowing.   Eyes: Negative for redness and itching.  Respiratory: Negative for cough, chest tightness, shortness of breath and wheezing.   Cardiovascular: Negative for palpitations and leg swelling.  Gastrointestinal: Negative  for nausea and vomiting.  Genitourinary: Negative for dysuria.  Musculoskeletal: Negative for joint swelling.  Skin: Negative for rash.  Allergic/Immunologic: Negative.  Negative for environmental allergies, food allergies and immunocompromised state.  Neurological: Negative for headaches.  Hematological: Does not bruise/bleed easily.  Psychiatric/Behavioral: Negative for dysphoric mood. The patient is not nervous/anxious.        Objective:   Physical Exam Constitutional:      Appearance: He is obese.  HENT:     Head: Normocephalic and atraumatic.     Right Ear: Tympanic membrane normal.     Nose: Nose normal. No congestion.     Mouth/Throat:     Mouth: Mucous membranes are moist.     Comments: Crowded oropharynx,  Mallampati 4 Eyes:     Extraocular Movements: Extraocular movements intact.     Pupils: Pupils are equal, round, and reactive to light.  Cardiovascular:     Rate and Rhythm: Normal rate and regular rhythm.     Pulses: Normal pulses.     Heart sounds: No murmur.  Pulmonary:     Effort: Pulmonary effort is normal. No respiratory distress.     Breath sounds: No stridor. No wheezing or rhonchi.  Musculoskeletal:        General: Normal range of motion.     Cervical back: Normal range of motion and neck supple. No rigidity.  Skin:    General: Skin is warm.  Neurological:     General: No focal deficit present.     Mental Status: He is alert.  Psychiatric:        Mood and Affect: Mood normal.    Vitals:   08/23/19 0933  BP: 120/74  Pulse: 80  Temp: (!) 97.3 F (36.3 C)  SpO2: 95%   Results of the Epworth flowsheet 08/23/2019  Sitting and reading 3  Watching TV 1  Sitting, inactive in a public place (e.g. a theatre or a meeting) 1  As a passenger in a car for an hour without a break 2  Lying down to rest in the afternoon when circumstances permit 1  Sitting and talking to someone 0  Sitting quietly after a lunch without alcohol 2  In a car, while stopped for a few minutes in traffic 0  Total score 10      Assessment & Plan:  .  High probability of significant obstructive sleep apnea. . Excessive daytime sleepiness .  Obesity  Pathophysiology of sleep disordered breathing discussed with the patient Treatment options for sleep disordered breathing discussed with the patient  Plan: We will schedule the patient for home sleep study  Encourage exercise and weight loss measures  CPAP therapy if positive for significant obstructive sleep apnea  I will see him back in the office in about 3 months  Risk of untreated sleep disordered breathing discussed with the patient

## 2019-08-23 NOTE — Patient Instructions (Signed)
High probability of significant obstructive sleep apnea  We will schedule you for home sleep study We will update you as results are reviewed I will see you back in about 3 months  Continue weight loss efforts  Call with any significant concerns Sleep Apnea Sleep apnea is a condition in which breathing pauses or becomes shallow during sleep. Episodes of sleep apnea usually last 10 seconds or longer, and they may occur as many as 20 times an hour. Sleep apnea disrupts your sleep and keeps your body from getting the rest that it needs. This condition can increase your risk of certain health problems, including:  Heart attack.  Stroke.  Obesity.  Diabetes.  Heart failure.  Irregular heartbeat. What are the causes? There are three kinds of sleep apnea:  Obstructive sleep apnea. This kind is caused by a blocked or collapsed airway.  Central sleep apnea. This kind happens when the part of the brain that controls breathing does not send the correct signals to the muscles that control breathing.  Mixed sleep apnea. This is a combination of obstructive and central sleep apnea. The most common cause of this condition is a collapsed or blocked airway. An airway can collapse or become blocked if:  Your throat muscles are abnormally relaxed.  Your tongue and tonsils are larger than normal.  You are overweight.  Your airway is smaller than normal. What increases the risk? You are more likely to develop this condition if you:  Are overweight.  Smoke.  Have a smaller than normal airway.  Are elderly.  Are male.  Drink alcohol.  Take sedatives or tranquilizers.  Have a family history of sleep apnea. What are the signs or symptoms? Symptoms of this condition include:  Trouble staying asleep.  Daytime sleepiness and tiredness.  Irritability.  Loud snoring.  Morning headaches.  Trouble concentrating.  Forgetfulness.  Decreased interest in sex.  Unexplained  sleepiness.  Mood swings.  Personality changes.  Feelings of depression.  Waking up often during the night to urinate.  Dry mouth.  Sore throat. How is this diagnosed? This condition may be diagnosed with:  A medical history.  A physical exam.  A series of tests that are done while you are sleeping (sleep study). These tests are usually done in a sleep lab, but they may also be done at home. How is this treated? Treatment for this condition aims to restore normal breathing and to ease symptoms during sleep. It may involve managing health issues that can affect breathing, such as high blood pressure or obesity. Treatment may include:  Sleeping on your side.  Using a decongestant if you have nasal congestion.  Avoiding the use of depressants, including alcohol, sedatives, and narcotics.  Losing weight if you are overweight.  Making changes to your diet.  Quitting smoking.  Using a device to open your airway while you sleep, such as: ? An oral appliance. This is a custom-made mouthpiece that shifts your lower jaw forward. ? A continuous positive airway pressure (CPAP) device. This device blows air through a mask when you breathe out (exhale). ? A nasal expiratory positive airway pressure (EPAP) device. This device has valves that you put into each nostril. ? A bi-level positive airway pressure (BPAP) device. This device blows air through a mask when you breathe in (inhale) and breathe out (exhale).  Having surgery if other treatments do not work. During surgery, excess tissue is removed to create a wider airway. It is important to get treatment for  sleep apnea. Without treatment, this condition can lead to:  High blood pressure.  Coronary artery disease.  In men, an inability to achieve or maintain an erection (impotence).  Reduced thinking abilities. Follow these instructions at home: Lifestyle  Make any lifestyle changes that your health care provider  recommends.  Eat a healthy, well-balanced diet.  Take steps to lose weight if you are overweight.  Avoid using depressants, including alcohol, sedatives, and narcotics.  Do not use any products that contain nicotine or tobacco, such as cigarettes, e-cigarettes, and chewing tobacco. If you need help quitting, ask your health care provider. General instructions  Take over-the-counter and prescription medicines only as told by your health care provider.  If you were given a device to open your airway while you sleep, use it only as told by your health care provider.  If you are having surgery, make sure to tell your health care provider you have sleep apnea. You may need to bring your device with you.  Keep all follow-up visits as told by your health care provider. This is important. Contact a health care provider if:  The device that you received to open your airway during sleep is uncomfortable or does not seem to be working.  Your symptoms do not improve.  Your symptoms get worse. Get help right away if:  You develop: ? Chest pain. ? Shortness of breath. ? Discomfort in your back, arms, or stomach.  You have: ? Trouble speaking. ? Weakness on one side of your body. ? Drooping in your face. These symptoms may represent a serious problem that is an emergency. Do not wait to see if the symptoms will go away. Get medical help right away. Call your local emergency services (911 in the [place] Do not drive yourself to the hospital. Summary  Sleep apnea is a condition in which breathing pauses or becomes shallow during sleep.  The most common cause is a collapsed or blocked airway.  The goal of treatment is to restore normal breathing and to ease symptoms during sleep. This information is not intended to replace advice given to you by your health care provider. Make sure you discuss any questions you have with your health care provider. Document Revised: 11/30/2018 Document  Reviewed: 02/08/2018 Elsevier Patient Education  2020 [employer].

## 2019-08-25 ENCOUNTER — Ambulatory Visit (HOSPITAL_COMMUNITY)
Admission: RE | Admit: 2019-08-25 | Discharge: 2019-08-25 | Disposition: A | Discharge status: Home / Self Care | Payer: BC Managed Care – PPO | Source: Ambulatory Visit | Attending: Family Medicine | Admitting: Family Medicine

## 2019-08-25 ENCOUNTER — Other Ambulatory Visit: Payer: Self-pay

## 2019-08-25 DIAGNOSIS — I77810 Thoracic aortic ectasia: Secondary | ICD-10-CM | POA: Diagnosis not present

## 2019-08-25 DIAGNOSIS — I712 Thoracic aortic aneurysm, without rupture: Secondary | ICD-10-CM | POA: Diagnosis not present

## 2019-08-25 MED ORDER — IOHEXOL 350 MG/ML SOLN
75.0000 mL | Freq: Once | INTRAVENOUS | Status: AC | PRN
  Administered 2019-08-25: 14:00:00 75 mL via INTRAVENOUS

## 2019-08-28 ENCOUNTER — Telehealth: Payer: Self-pay | Admitting: Medical

## 2019-08-28 NOTE — Telephone Encounter (Signed)
Patient's wife calling stating the patient received results, but was unclear on them. She would like a call back with clarification.

## 2019-08-28 NOTE — Telephone Encounter (Signed)
Called patient, spoke to wife- just to get clarification on results.  She verbalized understanding.

## 2019-09-01 DIAGNOSIS — L821 Other seborrheic keratosis: Secondary | ICD-10-CM | POA: Diagnosis not present

## 2019-09-01 DIAGNOSIS — L918 Other hypertrophic disorders of the skin: Secondary | ICD-10-CM | POA: Diagnosis not present

## 2019-10-06 ENCOUNTER — Other Ambulatory Visit: Payer: Self-pay

## 2019-10-06 ENCOUNTER — Ambulatory Visit: Payer: BC Managed Care – PPO

## 2019-10-06 DIAGNOSIS — G4733 Obstructive sleep apnea (adult) (pediatric): Secondary | ICD-10-CM

## 2019-10-10 NOTE — Progress Notes (Signed)
Cardiology Office Note    Date:  10/12/2019   ID:  Cody Villegas, DOB [birth year], MRN [id number]  PCP:  Shirlean Mylar, MD  Cardiologist:  Dr. Delton See   Chief Complaint: 2 Months follow up  History of Present Illness:   Cody Villegas is a 59 y.o. male HTN, DOE, obesity, OSA, dilated ascending aorta (4.1cm), pulmonary nodules and GERD seen for follow up.  Established care with Dr. Delton See in 2017 for DOE. 2D echo showed preserved EF and G1DD with no valvular abnormalities. The patient had an ETT that was abnormal. BP demonstrated a hypotensive response in stage 3 (that eventually recovered) with horizontal up-sloping ST segment depression of 1 mm in the inferior leads, returning to baseline after 1-5 minutes. A nuclear stress test was ordered showing a small, moderate intensity, fixed apical defect consistent with thinning; no ischemia, EF 57 with normal wall motion. No further testing was recommended at that time.   A coronary CT 2019 for DOE and chest discomfort showed a calcium score of 0, no evidence of CAD, and dilated pulmonary artery measuring 37 mm suggestive of pulmonary hypertension  Seen January 2021 for dyspnea and chest discomfort. Echocardiogram showed an EF of 60 to 65%, mildly reduced RV function and mildly enlarged RV, mild RAE, mildly dilated pulmonary artery but normal PASP. Thoracic aortic dilation was unchanged from 2017. He was started on amlodipine for elevated BP.  CTA of chest 07/2019 showed 4.1cm dilated ascending aorta.   Here today for follow up.  Recently completed home sleep study, ending result.  No regular exercise.  He is a Naval architect.  He stopped his amlodipine few days ago.  Denies chest pain, dizziness, palpitation, orthopnea, PND or melena.  He has chronic dyspnea on exertion.  Past Medical History:  Diagnosis Date  . Abnormality of right ventricle of heart   . Ascending aorta dilatation (HCC)   . Dilatation of pulmonic artery (HCC)    a. by CT 2019.   Marland Kitchen Dyspnea   . Esophageal reflux   . Essential hypertension   . Hx of major depression    resolved per pt 04/2006  . Hypogonadism, male   . Obesity   . OSA (obstructive sleep apnea)   . Prediabetes     Past Surgical History:  Procedure Laterality Date  . APPENDECTOMY    . FINGER AMPUTATION Left   . NO PAST SURGERIES    . REPLACEMENT TOTAL KNEE Right     Current Medications: Prior to Admission medications   Medication Sig Start Date End Date Taking? Authorizing Provider  albuterol (VENTOLIN HFA) 108 (90 Base) MCG/ACT inhaler as needed. 04/17/19   [provider]  amLODipine (NORVASC) 10 MG tablet Take 1 tablet (10 mg total) by mouth daily. 08/07/19 11/05/19  Netta Neat., NP  fluticasone Aleda Grana) 50 MCG/ACT nasal spray Place into both nostrils as needed for allergies or rhinitis.    [provider]  ibuprofen (ADVIL) 200 MG tablet Take 200 mg by mouth every 6 (six) hours as needed.    [provider]  omeprazole (PRILOSEC) 40 MG capsule Take 40 mg by mouth daily as needed.    [provider]    Allergies:   Septra [sulfamethoxazole-trimethoprim]   Social History   Socioeconomic History  . Marital status: Married    Spouse name: Not on file  . Number of children: Not on file  . Years of education: Not on file  . Highest education level: Not on  file  Occupational History  . Not on file  Tobacco Use  . Smoking status: Never Smoker  . Smokeless tobacco: Never Used  Substance and Sexual Activity  . Alcohol use: Never    Alcohol/week: 0.0 standard drinks  . Drug use: No  . Sexual activity: Not on file  Other Topics Concern  . Not on file  Social History Narrative  . Not on file   Social Determinants of Health   Financial Resource Strain:   . Difficulty of Paying Living Expenses:   Food Insecurity:   . Worried About Charity fundraiser in the Last Year:   . Arboriculturist in the Last Year:   Transportation Needs:   . Consulting civil engineer (Medical):   Marland Kitchen Lack of Transportation (Non-Medical):   Physical Activity:   . Days of Exercise per Week:   . Minutes of Exercise per Session:   Stress:   . Feeling of Stress :   Social Connections:   . Frequency of Communication with Friends and Family:   . Frequency of Social Gatherings with Friends and Family:   . Attends Religious Services:   . Active Member of Clubs or Organizations:   . Attends Archivist Meetings:   Marland Kitchen Marital Status:      Family History:  The patient's family history is not on file. He was adopted.   ROS:   Please see the history of present illness.    ROS All other systems reviewed and are negative.   PHYSICAL EXAM:   VS:  BP 138/90   Pulse 68   Ht 5' 6.5" (1.689 m)   Wt 241 lb 6.4 oz (109.5 kg)   SpO2 97%   BMI 38.38 kg/m    GEN: Well nourished, well developed, in no acute distress  HEENT: normal  Neck: no JVD, carotid bruits, or masses Cardiac: RRR; no murmurs, rubs, or gallops,no edema  Respiratory:  clear to auscultation bilaterally, normal work of breathing GI: soft, nontender, nondistended, + BS MS: no deformity or atrophy  Skin: warm and dry, no rash Neuro:  Alert and Oriented x 3, Strength and sensation are intact Psych: euthymic mood, full affect  Wt Readings from Last 3 Encounters:  10/12/19 241 lb 6.4 oz (109.5 kg)  08/23/19 236 lb 9.6 oz (107.3 kg)  08/07/19 239 lb 12.8 oz (108.8 kg)      Studies/Labs Reviewed:   EKG:  EKG is not  ordered today.    Recent Labs: 07/21/2019: BUN 18; Creatinine, Ser 0.85; Hemoglobin 15.6; Platelets 304; Potassium 4.2; Sodium 141; TSH 1.990   Lipid Panel    Component Value Date/Time   CHOL 157 09/09/2017 1106   TRIG 104 09/09/2017 1106   HDL 48 09/09/2017 1106   CHOLHDL 3.3 09/09/2017 1106   CHOLHDL 3.8 08/15/2015 0907   VLDL 33 (H) 08/15/2015 0907   LDLCALC 88 09/09/2017 1106    Additional studies/ records that were reviewed today include:    CT angio of  chest 07/19 IMPRESSION: 1. Mild aneurysmal dilatation of the ascending aorta, measuring up to 4.1 cm in diameter. This is stable from prior study in 2019. Recommend annual imaging followup by CTA or MRA. This recommendation follows 2010 ACCF/AHA/AATS/ACR/ASA/SCA/SCAI/SIR/STS/SVM Guidelines for the Diagnosis and Management of Patients with Thoracic Aortic Disease. Circulation. 2010; 121: Z610-R604. Aortic aneurysm NOS (ICD10-I71.9) 2. No acute pulmonary embolism identified.Dilated main pulmonary artery, nonspecific but can be seen with pulmonary arterial hypertension. 3. A 6 mm pulmonary  nodule in the medial right upper lobe, outside the field of view on the patient's prior study. Non-contrast chest CT at 6-12 months is recommended. If the nodule is stable at time of repeat CT, then future CT at 18-24 months (from today's scan) is considered optional for low-risk patients, but is recommended for high-risk patients. This recommendation follows the consensus statement: Guidelines for Management of Incidental Pulmonary Nodules Detected on CT Images: From the Fleischner Society 2017; Radiology 2017; 284:228-243.  Echo 06/2019 1. Left ventricular ejection fraction, by visual estimation, is 60 to  65%. The left ventricle has normal function. There is no left ventricular  hypertrophy.  2. The left ventricle has no regional wall motion abnormalities.  3. Global right ventricle has mildly reduced systolic function.The right  ventricular size is mildly enlarged. Right vetricular wall thickness was  not assessed.  4. Left atrial size was normal.  5. Right atrial size was mildly dilated.  6. The mitral valve is normal in structure. Trivial mitral valve  regurgitation.  7. The tricuspid valve is normal in structure.  8. The tricuspid valve is normal in structure. Tricuspid valve  regurgitation is trivial.  9. The aortic valve is tricuspid. Aortic valve regurgitation is not   visualized. No evidence of aortic valve sclerosis or stenosis.  10. The pulmonic valve was normal in structure. Pulmonic valve  regurgitation is trivial.  11. Aortic dilatation noted.  12. There is moderate dilatation of the ascending aorta measuring 45 mm.  13. TAA unchanged size from 2017.  14. Mildly dilated pulmonary artery.  15. Normal pulmonary artery systolic pressure.  16. Best view of PA shows size of 3.5 cm.  17. The atrial septum is grossly normal.   Coronary CT 01/2018 IMPRESSION: 1. Coronary calcium score of 0. This was 0 percentile for age and sex matched control.  2. Normal coronary origin with right dominance.  3. No evidence of CAD.  4. Dilated pulmonary artery measuring 37 mm suggestive of pulmonary hypertension.    ASSESSMENT & PLAN:    1. Chronic dyspnea on exertion Reassuring cardiac work-up as summarized above.  His symptoms likely due to morbid obesity and inactivity.  I have spent a great amount of time discussing lifestyle modification with heart healthy diet, weight loss and regular exercise.  2.  Dilated pulmonary artery/pulmonary nodules -Patient declined pulmonary referral -Encouraged to follow-up with PCP  3.  Snoring -Pending sleep study results  4.  Hypertension -Recently stopped his amlodipine. -Start ACE and recheck lab in few weeks.   4. Dilated ascending aorta - Stable on recent CT of chest   Medication Adjustments/Labs and Tests Ordered: Current medicines are reviewed at length with the patient today.  Concerns regarding medicines are outlined above.  Medication changes, Labs and Tests ordered today are listed in the Patient Instructions below. Patient Instructions  Medication Instructions:  Your physician has recommended you make the following change in your medication:  1.  START Lisinopril 5 mg daily  *If you need a refill on your cardiac medications before your next appointment, please call your pharmacy*   Lab Work:  None ordered  If you have labs (blood work) drawn today and your tests are completely normal, you will receive your results only by: Marland Kitchen MyChart Message (if you have MyChart) OR . A paper copy in the mail If you have any lab test that is abnormal or we need to change your treatment, we will call you to review the results.   Testing/Procedures:  None ordered   Follow-Up: At [facility], you and your health needs are our priority.  As part of our continuing mission to provide you with exceptional heart care, we have created designated Provider Care Teams.  These Care Teams include your primary Cardiologist (physician) and Advanced Practice Providers (APPs -  Physician Assistants and Nurse Practitioners) who all work together to provide you with the care you need, when you need it.  We recommend signing up for the patient portal called "MyChart".  Sign up information is provided on this After Visit Summary.  MyChart is used to connect with patients for Virtual Visits (Telemedicine).  Patients are able to view lab/test results, encounter notes, upcoming appointments, etc.  Non-urgent messages can be sent to your provider as well.   To learn more about what you can do with MyChart, go to [email or web address].    Your next appointment:   10/20/2019:  Wilmon Arms at 3:00 to see Ronie Spies, PA-C  6 month(s)  The format for your next appointment:   In Person  Provider:   You may see Tobias Alexander, MD or one of the following Advanced Practice Providers on your designated Care Team:    Ronie Spies, PA-C  Jacolyn Reedy, PA-C    Other Instructions      Signed, Manson Passey, [place]  10/12/2019 11:46 AM    [facility] [address] [place], [place], [place]  [address] Phone: [phone]; Fax: [phone]

## 2019-10-11 ENCOUNTER — Ambulatory Visit: Payer: BC Managed Care – PPO | Admitting: Physician Assistant

## 2019-10-12 ENCOUNTER — Other Ambulatory Visit: Payer: Self-pay

## 2019-10-12 ENCOUNTER — Ambulatory Visit: Payer: BC Managed Care – PPO | Admitting: Physician Assistant

## 2019-10-12 ENCOUNTER — Encounter: Payer: Self-pay | Admitting: Physician Assistant

## 2019-10-12 ENCOUNTER — Telehealth: Payer: Self-pay | Admitting: Pulmonary Disease

## 2019-10-12 VITALS — BP 138/90 | HR 68 | Ht 66.5 in | Wt 241.4 lb

## 2019-10-12 DIAGNOSIS — I10 Essential (primary) hypertension: Secondary | ICD-10-CM

## 2019-10-12 DIAGNOSIS — R0609 Other forms of dyspnea: Secondary | ICD-10-CM

## 2019-10-12 DIAGNOSIS — I77810 Thoracic aortic ectasia: Secondary | ICD-10-CM

## 2019-10-12 DIAGNOSIS — R0600 Dyspnea, unspecified: Secondary | ICD-10-CM | POA: Diagnosis not present

## 2019-10-12 DIAGNOSIS — R0683 Snoring: Secondary | ICD-10-CM

## 2019-10-12 DIAGNOSIS — G4733 Obstructive sleep apnea (adult) (pediatric): Secondary | ICD-10-CM

## 2019-10-12 MED ORDER — LISINOPRIL 5 MG PO TABS: 5.0000 mg | ORAL_TABLET | Freq: Every day | ORAL | 3 refills | Status: DC

## 2019-10-12 NOTE — Telephone Encounter (Signed)
Patient contacted by phone, identification verified. Results of home sleep study reviewed. Recommendations per Dr. Wynona Neat include  Recommend CPAP therapy for moderate obstructive sleep apnea  Auto titrate CPAP with pressure settings of 5-15 will be appropriate  Close clinical follow up with compliance monitoring to optimize therapeutic efficiency  Weight loss measures encouraged  Patient should be cautioned against driving when sleepy and against medications with sedative side effects.  Patient verbalized understanding of results and recommendations. Orders placed for CPAP.

## 2019-10-12 NOTE — Patient Instructions (Addendum)
Medication Instructions:  Your physician has recommended you make the following change in your medication:  1.  START Lisinopril 5 mg daily  *If you need a refill on your cardiac medications before your next appointment, please call your pharmacy*   Lab Work: None ordered  If you have labs (blood work) drawn today and your tests are completely normal, you will receive your results only by: Marland Kitchen MyChart Message (if you have MyChart) OR . A paper copy in the mail If you have any lab test that is abnormal or we need to change your treatment, we will call you to review the results.   Testing/Procedures: None ordered   Follow-Up: At [facility], you and your health needs are our priority.  As part of our continuing mission to provide you with exceptional heart care, we have created designated Provider Care Teams.  These Care Teams include your primary Cardiologist (physician) and Advanced Practice Providers (APPs -  Physician Assistants and Nurse Practitioners) who all work together to provide you with the care you need, when you need it.  We recommend signing up for the patient portal called "MyChart".  Sign up information is provided on this After Visit Summary.  MyChart is used to connect with patients for Virtual Visits (Telemedicine).  Patients are able to view lab/test results, encounter notes, upcoming appointments, etc.  Non-urgent messages can be sent to your provider as well.   To learn more about what you can do with MyChart, go to [email or web address].    Your next appointment:   10/20/2019:  Wilmon Arms at 3:00 to see Ronie Spies, PA-C  6 month(s)  The format for your next appointment:   In Person  Provider:   You may see Tobias Alexander, MD or one of the following Advanced Practice Providers on your designated Care Team:    Ronie Spies, PA-C  Jacolyn Reedy, PA-C    Other Instructions

## 2019-10-19 NOTE — Progress Notes (Deleted)
Cardiology Office Note    Date:  10/19/2019   ID:  Cody Villegas, DOB 08-14-1960, MRN [id number]  PCP:  Shirlean Mylar, MD  Cardiologist:  Tobias Alexander, MD  Electrophysiologist:  None   Chief Complaint: f/u BP  History of Present Illness:   Cody Villegas is a 59 y.o. male with history of esophageal reflux, male hypogonadism, obesity, OSA, pre-DM, prior depression, dilation of ascending aorta, pulmonary nodule who returns for blood pressure follow-up.   He established with Dr. Delton See in 2017 for CP/dyspnea. 2D echo 06/2015 showed EF 55-60%, mild FSBH of the septum, grade 1 DD, mildly dilated RV. He also had a ETT which was abnormal. Blood pressure demonstrated a hypotensive response to exercise in stage 3 going from 154/15mmHg to 120/7mmHg but then increased to 124/8mmHg in recovery with a peak in recovery of 205/58mmHg. There was Horizontal to upsloping ST segment depression of 1 mm noted during stress in the inferior leads, returning to baseline after 1-5 minutes of recovery. Dr. Delton See recommended further testing and ordered a nuclear stress test. This showed a small, moderate intensity, fixed apical defect consistent with thinning; no ischemia; EF 57 with normal wall motion. No further ischemic w/u was recommended at that time. He was seen back in follow-up in 2019 for continued exertional dyspnea. Fortunately, coronary CTA 01/2018 showed no evidence of CAD. This did show dilated pulmonary artery measuring 37 mm suggestive of pulmonary hypertension.   He recently returned back for follow-up 06/2019 and was seen by Cadence Furth PA-C. At that time he reported atypical chest pain lasting a few seconds about once a week, felt likely MSK. He was also reporting fatigue. His blood pressure was elevated. Amlodipine was started. 2D echo 07/28/19 showed EF 60-65%, mildly reduced RV function and mildly enlarged RV, mild RAE, mildly dilated pulmonary artery but normal PASP, moderate dilation of ascending  aorta felt similar to prior. Notably, aorta was reported to be normal size in 2019. He has since seen 2 other APPs for follow-up. CT scan was arranged 08/25/19 that showed mild aneurysmal dilation of ascending aorta to 4.1cm, stable from prior study, annual follow-up recommended. There was dilated pulmonary artery and 58mm pulmonary nodule in medial RUL with recommendation for noncontrasted CT in 6-12 months. His blood pressure was elevated recently so Vin Bhagat started lisinopril. Last labs personally reviewed include 06/2019 TSH wnl, CBC wnl, K 4.2, Cr 0.85, glucose 112, 08/2017 trig 104, LDL 88, K 4.2, Cr 0.90. He declined pulmonary evaluation but is following with neurology for management of moderately severe OSA diagnosed by home sleep study recently.  why stopped amlodipine? PFTS/pulm eval declined CT/MRA with DOT BP f/u -> needs BMET pulm nodule  wt  Essental HTN Abnormal right ventricular function on echocardiogram  Dilated pulmonary artery  Dilation of ascending aorta  Pulmonary nodule   Past Medical History:  Diagnosis Date  . Abnormality of right ventricle of heart   . Ascending aorta dilatation (HCC)   . Dilatation of pulmonic artery (HCC)    a. by CT 2019.  Marland Kitchen Dyspnea   . Esophageal reflux   . Essential hypertension   . Hx of major depression    resolved per pt 04/2006  . Hypogonadism, male   . Obesity   . OSA (obstructive sleep apnea)   . Prediabetes     Past Surgical History:  Procedure Laterality Date  . APPENDECTOMY    . FINGER AMPUTATION Left   . NO PAST SURGERIES    .  REPLACEMENT TOTAL KNEE Right     Current Medications: No outpatient medications have been marked as taking for the 10/20/19 encounter (Appointment) with Laurann Montana, PA-C.   ***   Allergies:   Septra [sulfamethoxazole-trimethoprim]   Social History   Socioeconomic History  . Marital status: Married    Spouse name: Not on file  . Number of children: Not on file  . Years of education:  Not on file  . Highest education level: Not on file  Occupational History  . Not on file  Tobacco Use  . Smoking status: Never Smoker  . Smokeless tobacco: Never Used  Substance and Sexual Activity  . Alcohol use: Never    Alcohol/week: 0.0 standard drinks  . Drug use: No  . Sexual activity: Not on file  Other Topics Concern  . Not on file  Social History Narrative  . Not on file   Social Determinants of Health   Financial Resource Strain:   . Difficulty of Paying Living Expenses:   Food Insecurity:   . Worried About Programme researcher, broadcasting/film/video in the Last Year:   . Barista in the Last Year:   Transportation Needs:   . Freight forwarder (Medical):   Marland Kitchen Lack of Transportation (Non-Medical):   Physical Activity:   . Days of Exercise per Week:   . Minutes of Exercise per Session:   Stress:   . Feeling of Stress :   Social Connections:   . Frequency of Communication with Friends and Family:   . Frequency of Social Gatherings with Friends and Family:   . Attends Religious Services:   . Active Member of Clubs or Organizations:   . Attends Banker Meetings:   Marland Kitchen Marital Status:      Family History:  The patient's ***family history is not on file. He was adopted.  ROS:   Please see the history of present illness. Otherwise, review of systems is positive for ***.  All other systems are reviewed and otherwise negative.    EKGs/Labs/Other Studies Reviewed:    Studies reviewed are outlined and summarized above. Reports included below if pertinent.  2D echo 06/2019  1. Left ventricular ejection fraction, by visual estimation, is 60 to  65%. The left ventricle has normal function. There is no left ventricular  hypertrophy.  2. The left ventricle has no regional wall motion abnormalities.  3. Global right ventricle has mildly reduced systolic function.The right  ventricular size is mildly enlarged. Right vetricular wall thickness was  not assessed.  4.  Left atrial size was normal.  5. Right atrial size was mildly dilated.  6. The mitral valve is normal in structure. Trivial mitral valve  regurgitation.  7. The tricuspid valve is normal in structure.  8. The tricuspid valve is normal in structure. Tricuspid valve  regurgitation is trivial.  9. The aortic valve is tricuspid. Aortic valve regurgitation is not  visualized. No evidence of aortic valve sclerosis or stenosis.  10. The pulmonic valve was normal in structure. Pulmonic valve  regurgitation is trivial.  11. Aortic dilatation noted.  12. There is moderate dilatation of the ascending aorta measuring 45 mm.  13. TAA unchanged size from 2017.  14. Mildly dilated pulmonary artery.  15. Normal pulmonary artery systolic pressure.  16. Best view of PA shows size of 3.5 cm.  17. The atrial septum is grossly normal.   CT Angio 07/2019 FINDINGS: Cardiovascular: There is no evidence for a thoracic aortic  dissection. There is aneurysmal dilatation of the ascending aorta measuring approximately 4.1 cm (axial series 7, image 50). The aortic annulus measures approximately 2.5 cm. The sinus of Valsalva measures approximately 3.4 cm. The sino-tubular junction measures approximately 2.6 cm. Aortic arch proximal to the takeoff of the left subclavian artery measures approximately 2.9 cm. The descending thoracic aorta measures approximately 2.8 cm. Again noted is a dilated main pulmonary artery measuring approximately 3.8 cm. There is no acute pulmonary embolism. The heart size is normal. There is no significant pericardial effusion. The arch vessels are patent where visualized.  Mediastinum/Nodes:  --No mediastinal or hilar lymphadenopathy.  --No axillary lymphadenopathy.  --No supraclavicular lymphadenopathy.  --Normal thyroid gland.  --The esophagus is unremarkable  Lungs/Pleura: There is a 6 mm pulmonary nodule in the medial right upper lobe (axial series 8, image 43).  This pulmonary nodule was outside the field of view on the patient's prior study.  Upper Abdomen: No acute abnormality.  Musculoskeletal: No chest wall abnormality. No acute or significant osseous findings.  Review of the MIP images confirms the above findings.  IMPRESSION: 1. Mild aneurysmal dilatation of the ascending aorta, measuring up to 4.1 cm in diameter. This is stable from prior study in 2019. Recommend annual imaging followup by CTA or MRA. This recommendation follows 2010 ACCF/AHA/AATS/ACR/ASA/SCA/SCAI/SIR/STS/SVM Guidelines for the Diagnosis and Management of Patients with Thoracic Aortic Disease. Circulation. 2010; 121: P619-J093. Aortic aneurysm NOS (ICD10-I71.9) 2. No acute pulmonary embolism identified.Dilated main pulmonary artery, nonspecific but can be seen with pulmonary arterial hypertension. 3. A 6 mm pulmonary nodule in the medial right upper lobe, outside the field of view on the patient's prior study. Non-contrast chest CT at 6-12 months is recommended. If the nodule is stable at time of repeat CT, then future CT at 18-24 months (from today's scan) is considered optional for low-risk patients, but is recommended for high-risk patients. This recommendation follows the consensus statement: Guidelines for Management of Incidental Pulmonary Nodules Detected on CT Images: From the Fleischner Society 2017; Radiology 2017; 284:228-243.   Electronically Signed   By: Constance Holster M.D.   On: 08/25/2019 21:17     EKG:  EKG is ordered today, personally reviewed, demonstrating ***  Recent Labs: 07/21/2019: BUN 18; Creatinine, Ser 0.85; Hemoglobin 15.6; Platelets 304; Potassium 4.2; Sodium 141; TSH 1.990  Recent Lipid Panel    Component Value Date/Time   CHOL 157 09/09/2017 1106   TRIG 104 09/09/2017 1106   HDL 48 09/09/2017 1106   CHOLHDL 3.3 09/09/2017 1106   CHOLHDL 3.8 08/15/2015 0907   VLDL 33 (H) 08/15/2015 0907   LDLCALC 88 09/09/2017  1106    PHYSICAL EXAM:    VS:  There were no vitals taken for this visit.  BMI: There is no height or weight on file to calculate BMI.  GEN: Well nourished, well developed, in no acute distress HEENT: normocephalic, atraumatic Neck: no JVD, carotid bruits, or masses Cardiac: ***RRR; no murmurs, rubs, or gallops, no edema  Respiratory:  clear to auscultation bilaterally, normal work of breathing GI: soft, nontender, nondistended, + BS MS: no deformity or atrophy Skin: warm and dry, no rash Neuro:  Alert and Oriented x 3, Strength and sensation are intact, follows commands Psych: euthymic mood, full affect  Wt Readings from Last 3 Encounters:  10/12/19 241 lb 6.4 Cody (109.5 kg)  08/23/19 236 lb 9.6 Cody (107.3 kg)  08/07/19 239 lb 12.8 Cody (108.8 kg)     ASSESSMENT & PLAN:   1. ***  Disposition: F/u with ***   Medication Adjustments/Labs and Tests Ordered: Current medicines are reviewed at length with the patient today.  Concerns regarding medicines are outlined above. Medication changes, Labs and Tests ordered today are summarized above and listed in the Patient Instructions accessible in Encounters.   Signed, Laurann Montana, PA-C  10/19/2019 1:07 PM    [facility] [address] [place], [place], [place]  [address] Phone: [phone]; Fax: [phone]

## 2019-10-20 ENCOUNTER — Ambulatory Visit: Payer: BC Managed Care – PPO | Admitting: Physician Assistant

## 2019-10-20 DIAGNOSIS — Q208 Other congenital malformations of cardiac chambers and connections: Secondary | ICD-10-CM

## 2019-10-20 DIAGNOSIS — R911 Solitary pulmonary nodule: Secondary | ICD-10-CM

## 2019-10-20 DIAGNOSIS — I77810 Thoracic aortic ectasia: Secondary | ICD-10-CM

## 2019-10-20 DIAGNOSIS — I288 Other diseases of pulmonary vessels: Secondary | ICD-10-CM

## 2019-10-20 DIAGNOSIS — I10 Essential (primary) hypertension: Secondary | ICD-10-CM

## 2019-10-23 ENCOUNTER — Other Ambulatory Visit: Payer: BC Managed Care – PPO

## 2019-11-06 DIAGNOSIS — F4323 Adjustment disorder with mixed anxiety and depressed mood: Secondary | ICD-10-CM | POA: Diagnosis not present

## 2019-11-14 DIAGNOSIS — F4323 Adjustment disorder with mixed anxiety and depressed mood: Secondary | ICD-10-CM | POA: Diagnosis not present

## 2020-01-26 DIAGNOSIS — K219 Gastro-esophageal reflux disease without esophagitis: Secondary | ICD-10-CM | POA: Diagnosis not present

## 2020-01-26 DIAGNOSIS — Z1211 Encounter for screening for malignant neoplasm of colon: Secondary | ICD-10-CM | POA: Diagnosis not present

## 2020-01-26 DIAGNOSIS — I10 Essential (primary) hypertension: Secondary | ICD-10-CM | POA: Diagnosis not present

## 2020-02-05 DIAGNOSIS — F4323 Adjustment disorder with mixed anxiety and depressed mood: Secondary | ICD-10-CM | POA: Diagnosis not present

## 2020-02-28 DIAGNOSIS — Z8616 Personal history of COVID-19: Secondary | ICD-10-CM

## 2020-02-28 HISTORY — DX: Personal history of COVID-19: Z86.16

## 2020-03-01 DIAGNOSIS — R519 Headache, unspecified: Secondary | ICD-10-CM | POA: Diagnosis not present

## 2020-03-01 DIAGNOSIS — R509 Fever, unspecified: Secondary | ICD-10-CM | POA: Diagnosis not present

## 2020-03-01 DIAGNOSIS — Z20828 Contact with and (suspected) exposure to other viral communicable diseases: Secondary | ICD-10-CM | POA: Diagnosis not present

## 2020-03-01 DIAGNOSIS — R05 Cough: Secondary | ICD-10-CM | POA: Diagnosis not present

## 2020-03-08 DIAGNOSIS — U071 COVID-19: Secondary | ICD-10-CM | POA: Diagnosis not present

## 2020-03-09 IMAGING — CT CT ANGIO CHEST
2 of 7 series · 17 of 46 positions shown · IV contrast (APPLIED)
Comparison: CT dated 02/04/2018.

CLINICAL DATA: Aortic disease.

EXAM:
CT ANGIOGRAPHY CHEST WITH CONTRAST
TECHNIQUE: Multidetector CT imaging of the chest was performed using the
standard protocol during bolus administration of intravenous
contrast. Multiplanar CT image reconstructions and MIPs were
obtained to evaluate the vascular anatomy.
CONTRAST:  75mL OMNIPAQUE IOHEXOL 350 MG/ML SOLN

[Series 9: thins · axial · 0.84mm/px · z∈[-329,-59]mm · 14 of 435 slices shown]
[im 25/435  lung]
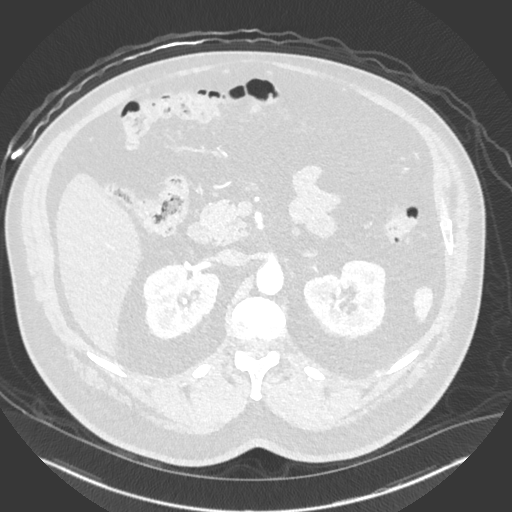
[im 49/435  soft-tissue]
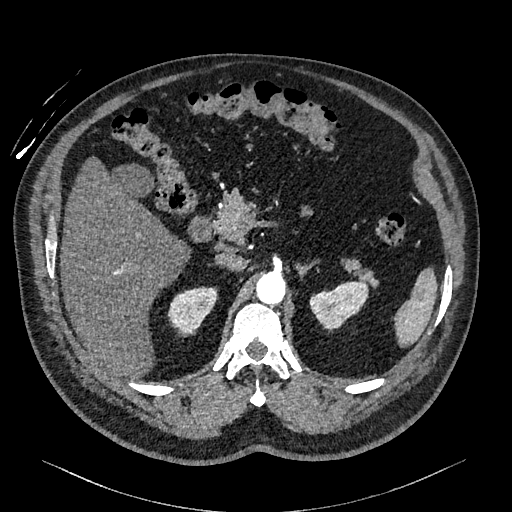
[im 97/435  lung]
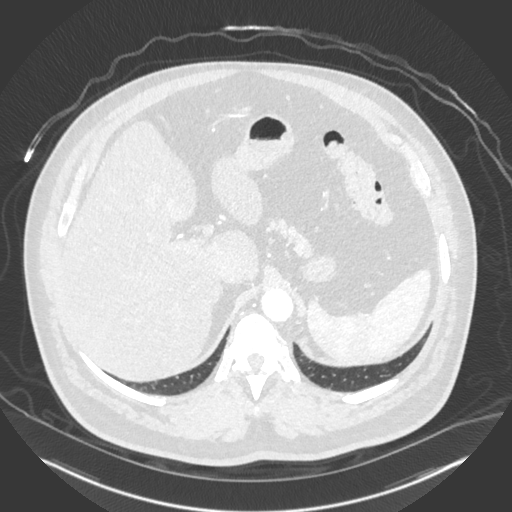
[im 121/435  soft-tissue]
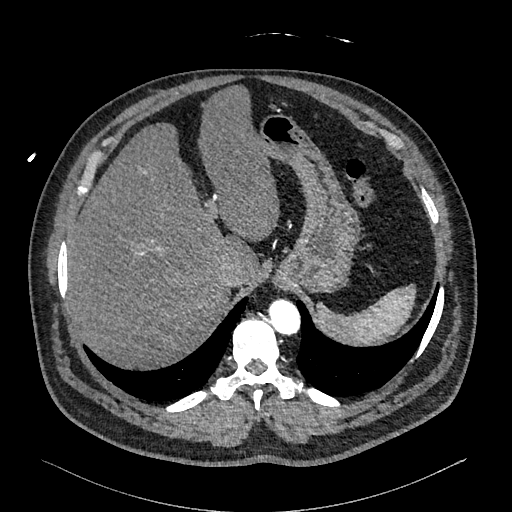
[im 145/435  lung]
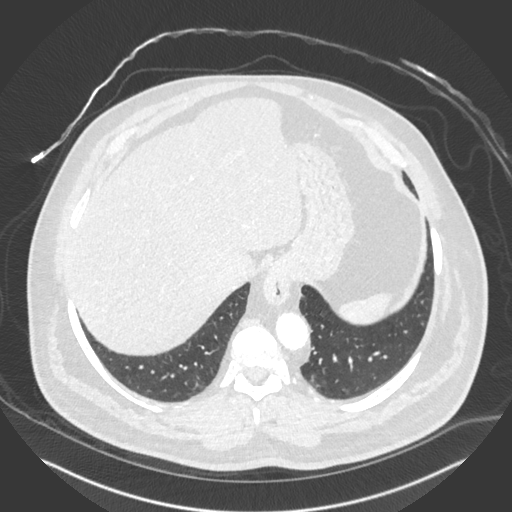
[im 169/435  soft-tissue]
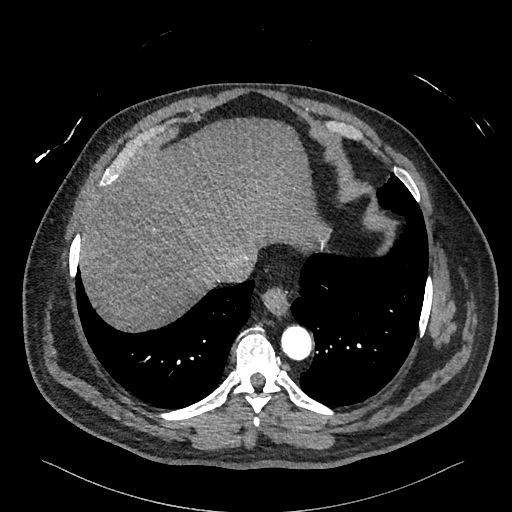
[im 193/435  lung]
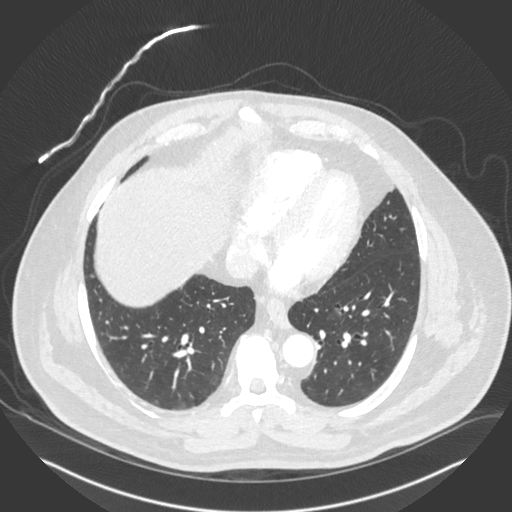
[im 242/435  soft-tissue]
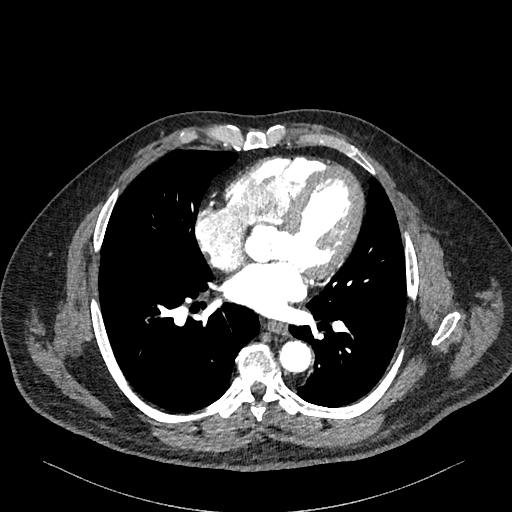
[im 266/435  lung]
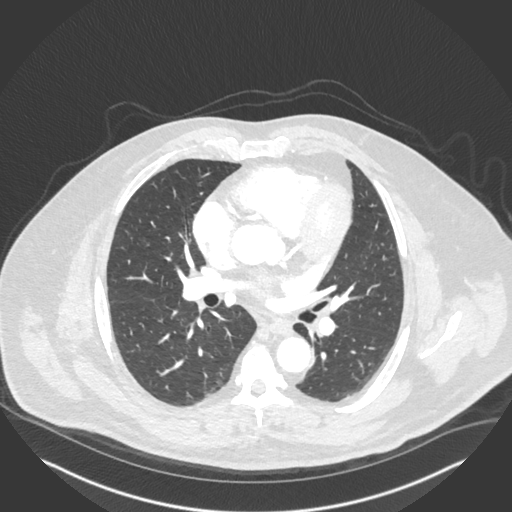
[im 290/435  soft-tissue]
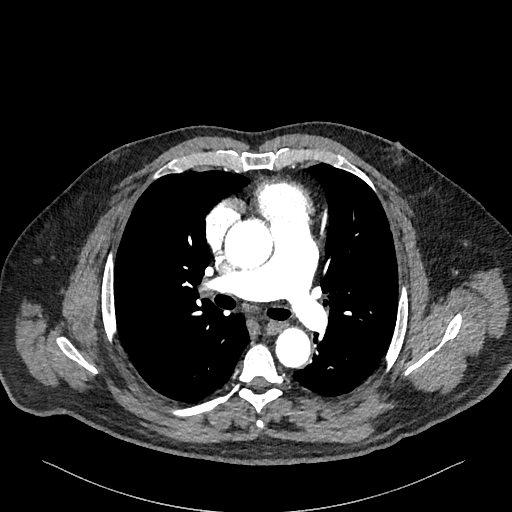
[im 314/435  lung]
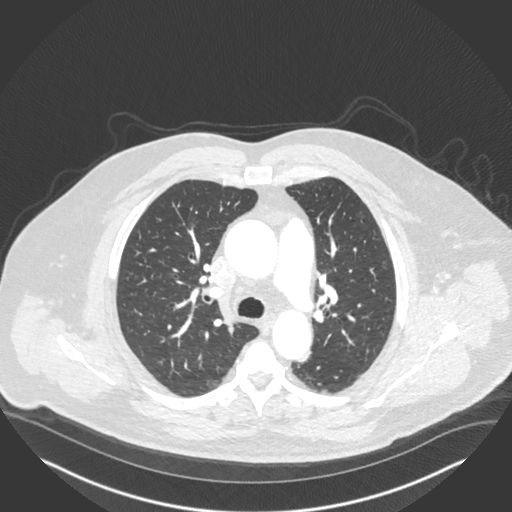
[im 338/435  soft-tissue]
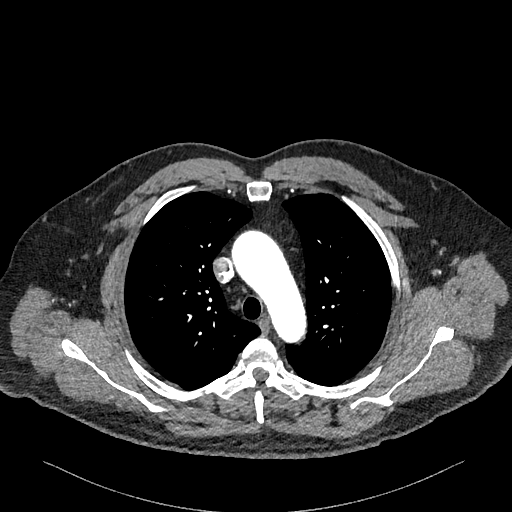
[im 386/435  lung]
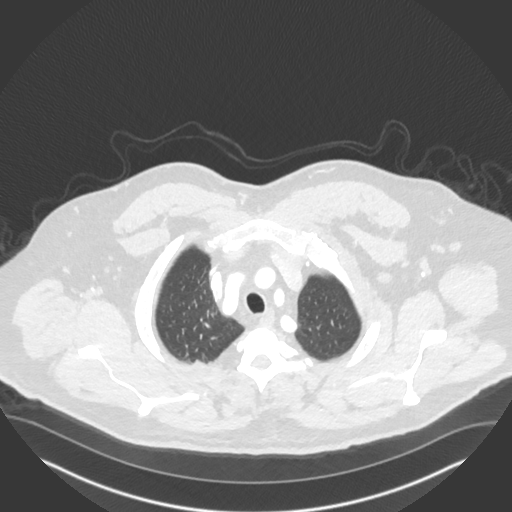
[im 410/435  soft-tissue]
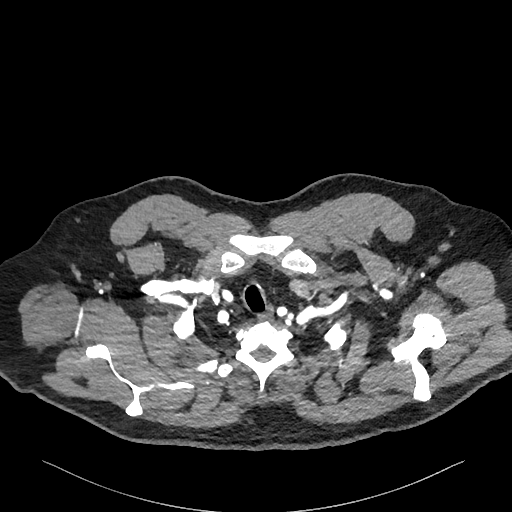

[Series 10: cor · coronal · 0.61mm/px · 3 of 182 slices shown]
[im 46/182  soft-tissue]
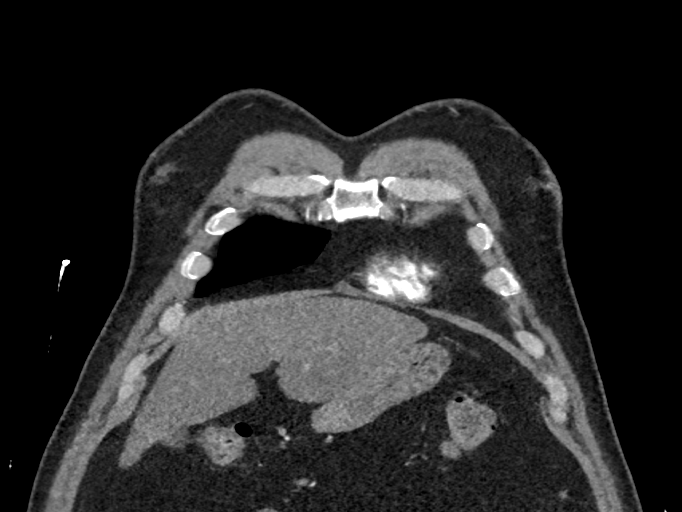
[im 91/182  soft-tissue]
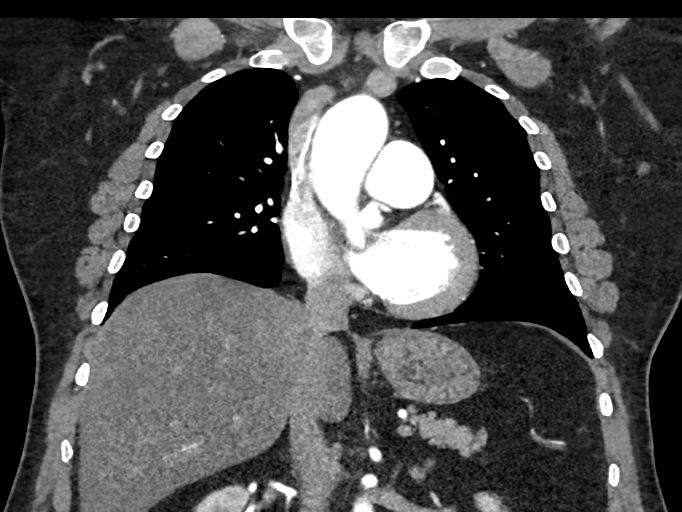
[im 136/182  soft-tissue]
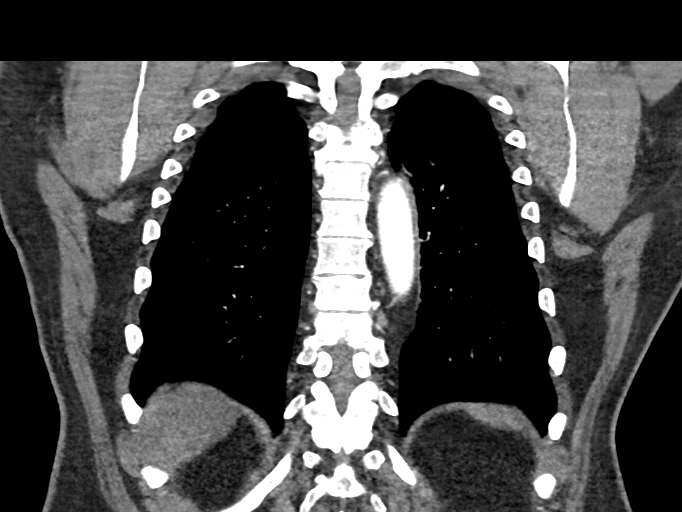

[17 of 46 positions shown; findings below may reference images not displayed]

FINDINGS: Cardiovascular: There is no evidence for a thoracic aortic
dissection. There is aneurysmal dilatation of the ascending aorta
measuring approximately 4.1 cm (axial series 7, image 50). The
aortic annulus measures approximately 2.5 cm. The sinus of Valsalva
measures approximately 3.4 cm. The [place] junction measures
approximately 2.6 cm. Aortic arch proximal to the takeoff of the
left subclavian artery measures approximately 2.9 cm. The descending
thoracic aorta measures approximately 2.8 cm. Again noted is a
dilated main pulmonary artery measuring approximately 3.8 cm. There
is no acute pulmonary embolism. The heart size is normal. There is
no significant pericardial effusion. The arch vessels are patent
where visualized.

Mediastinum/Nodes:

--No mediastinal or hilar lymphadenopathy.

--No axillary lymphadenopathy.

--No supraclavicular lymphadenopathy.

--Normal thyroid gland.

--The esophagus is unremarkable

Lungs/Pleura: There is a 6 mm pulmonary nodule in the medial right
upper lobe (axial series 8, image 43). This pulmonary nodule was
outside the field of view on the patient's prior study.

Upper Abdomen: No acute abnormality.

Musculoskeletal: No chest wall abnormality. No acute or significant
osseous findings.

Review of the MIP images confirms the above findings.
IMPRESSION: 1. Mild aneurysmal dilatation of the ascending aorta, measuring up
to 4.1 cm in diameter. This is stable from prior study in 3309.
Recommend annual imaging followup by CTA or MRA. This recommendation
follows 3757 ACCF/AHA/AATS/ACR/ASA/SCA/PIET VAN/GARUTI/TIGER/MACHHI Guidelines
for the Diagnosis and Management of Patients with Thoracic Aortic
Disease. Circulation. 3757; 121: E266-e369. Aortic aneurysm NOS
([id number].2)
2. No acute pulmonary embolism identified.Dilated main pulmonary
artery, nonspecific but can be seen with pulmonary arterial
hypertension.
3. A 6 mm pulmonary nodule in the medial right upper lobe, outside
the field of view on the patient's prior study. Non-contrast chest
CT at 6-12 months is recommended. If the nodule is stable at time of
repeat CT, then future CT at 18-24 months (from today's scan) is
considered optional for low-risk patients, but is recommended for
high-risk patients. This recommendation follows the consensus
statement: Guidelines for Management of Incidental Pulmonary Nodules
Detected on CT Images: From the [HOSPITAL] 9214; Radiology

## 2020-03-14 DIAGNOSIS — U071 COVID-19: Secondary | ICD-10-CM | POA: Diagnosis not present

## 2020-03-20 DIAGNOSIS — Z20828 Contact with and (suspected) exposure to other viral communicable diseases: Secondary | ICD-10-CM | POA: Diagnosis not present

## 2020-03-20 DIAGNOSIS — J189 Pneumonia, unspecified organism: Secondary | ICD-10-CM | POA: Diagnosis not present

## 2020-04-12 DIAGNOSIS — L304 Erythema intertrigo: Secondary | ICD-10-CM | POA: Diagnosis not present

## 2020-04-12 DIAGNOSIS — L739 Follicular disorder, unspecified: Secondary | ICD-10-CM | POA: Diagnosis not present

## 2020-05-07 ENCOUNTER — Encounter: Payer: Self-pay | Admitting: Cardiology

## 2020-05-07 ENCOUNTER — Other Ambulatory Visit: Payer: Self-pay

## 2020-05-07 ENCOUNTER — Ambulatory Visit: Payer: BC Managed Care – PPO | Admitting: Cardiology

## 2020-05-07 VITALS — BP 146/90 | HR 101 | Ht 66.5 in | Wt 246.8 lb

## 2020-05-07 DIAGNOSIS — I10 Essential (primary) hypertension: Secondary | ICD-10-CM | POA: Diagnosis not present

## 2020-05-07 DIAGNOSIS — E782 Mixed hyperlipidemia: Secondary | ICD-10-CM | POA: Diagnosis not present

## 2020-05-07 DIAGNOSIS — I77810 Thoracic aortic ectasia: Secondary | ICD-10-CM

## 2020-05-07 DIAGNOSIS — R7303 Prediabetes: Secondary | ICD-10-CM

## 2020-05-07 DIAGNOSIS — R911 Solitary pulmonary nodule: Secondary | ICD-10-CM | POA: Diagnosis not present

## 2020-05-07 DIAGNOSIS — R0683 Snoring: Secondary | ICD-10-CM

## 2020-05-07 DIAGNOSIS — G4733 Obstructive sleep apnea (adult) (pediatric): Secondary | ICD-10-CM

## 2020-05-07 LAB — COMPREHENSIVE METABOLIC PANEL
ALT: 31 IU/L (ref 0–44)
AST: 34 IU/L (ref 0–40)
Albumin/Globulin Ratio: 1.9 (ref 1.2–2.2)
Albumin: 4.6 g/dL (ref 3.8–4.9)
Alkaline Phosphatase: 99 IU/L (ref 44–121)
BUN/Creatinine Ratio: 14 (ref 9–20)
BUN: 12 mg/dL (ref 6–24)
Bilirubin Total: 0.5 mg/dL (ref 0.0–1.2)
CO2: 23 mmol/L (ref 20–29)
Calcium: 10 mg/dL (ref 8.7–10.2)
Chloride: 103 mmol/L (ref 96–106)
Creatinine, Ser: 0.88 mg/dL (ref 0.76–1.27)
GFR calc Af Amer: 109 mL/min/{1.73_m2} (ref >59)
GFR calc non Af Amer: 95 mL/min/{1.73_m2} (ref >59)
Globulin, Total: 2.4 g/dL (ref 1.5–4.5)
Glucose: 116 mg/dL — ABNORMAL HIGH (ref 65–99)
Potassium: 4.3 mmol/L (ref 3.5–5.2)
Sodium: 141 mmol/L (ref 134–144)
Total Protein: 7 g/dL (ref 6.0–8.5)

## 2020-05-07 LAB — CBC
Hematocrit: 44.7 % (ref 37.5–51.0)
Hemoglobin: 15.1 g/dL (ref 13.0–17.7)
MCH: 31.5 pg (ref 26.6–33.0)
MCHC: 33.8 g/dL (ref 31.5–35.7)
MCV: 93 fL (ref 79–97)
Platelets: 254 10*3/uL (ref 150–450)
RBC: 4.8 x10E6/uL (ref 4.14–5.80)
RDW: 13.2 % (ref 11.6–15.4)
WBC: 8.4 10*3/uL (ref 3.4–10.8)

## 2020-05-07 LAB — LIPID PANEL
Chol/HDL Ratio: 3.6 ratio (ref 0.0–5.0)
Cholesterol, Total: 184 mg/dL (ref 100–199)
HDL: 51 mg/dL (ref >39)
LDL Chol Calc (NIH): 105 mg/dL — ABNORMAL HIGH (ref 0–99)
Triglycerides: 163 mg/dL — ABNORMAL HIGH (ref 0–149)
VLDL Cholesterol Cal: 28 mg/dL (ref 5–40)

## 2020-05-07 LAB — TSH: TSH: 3.1 u[IU]/mL (ref 0.450–4.500)

## 2020-05-07 MED ORDER — VALSARTAN 160 MG PO TABS
160.0000 mg | ORAL_TABLET | Freq: Every day | ORAL | 1 refills | Status: DC
Start: 2020-05-07 — End: 2020-11-04

## 2020-05-07 NOTE — Progress Notes (Signed)
Cardiology Office Note    Date:  05/07/2020   ID:  Cody Villegas, DOB May 08, 1961, MRN [id number]  PCP:  Shirlean Mylar, MD  Cardiologist:  Dr. Delton See   Chief Complaint: 6 Months follow up  History of Present Illness:   Cody Villegas is a 59 y.o. male HTN, DOE, obesity, OSA, dilated ascending aorta (4.1cm), pulmonary nodules and GERD seen for follow up.  Established care with Dr. Delton See in 2017 for DOE. 2D echo showed preserved EF and G1DD with no valvular abnormalities. The patient had an ETT that was abnormal. BP demonstrated a hypotensive response in stage 3 (that eventually recovered) with horizontal up-sloping ST segment depression of 1 mm in the inferior leads, returning to baseline after 1-5 minutes. A nuclear stress test was ordered showing a small, moderate intensity, fixed apical defect consistent with thinning; no ischemia, EF 57 with normal wall motion. No further testing was recommended at that time.   A coronary CT 2019 for DOE and chest discomfort showed a calcium score of 0, no evidence of CAD, and dilated pulmonary artery measuring 37 mm suggestive of pulmonary hypertension  Seen January 2021 for dyspnea and chest discomfort. Echocardiogram showed an EF of 60 to 65%, mildly reduced RV function and mildly enlarged RV, mild RAE, mildly dilated pulmonary artery but normal PASP. Thoracic aortic dilation was unchanged from 2017. He was started on amlodipine for elevated BP.  CTA of chest 07/2019 showed 4.1cm dilated ascending aorta.   05/07/2020 -the patient is coming after 6 months he states that he has developed some shortness of breath since he stopped exercising and gained weight.  He has also stopped using blood pressure medications and his blood pressure has been in the 140s.  He previously developed lower extremity edema with amlodipine.  He denies any chest pain, no cough.     Past Medical History:  Diagnosis Date  . Abnormality of right ventricle of heart   .  Ascending aorta dilatation (HCC)   . Dilatation of pulmonic artery (HCC)    a. by CT 2019.  Marland Kitchen Dyspnea   . Esophageal reflux   . Essential hypertension   . Hx of major depression    resolved per pt 04/2006  . Hypogonadism, male   . Obesity   . OSA (obstructive sleep apnea)   . Prediabetes     Past Surgical History:  Procedure Laterality Date  . APPENDECTOMY    . FINGER AMPUTATION Left   . NO PAST SURGERIES    . REPLACEMENT TOTAL KNEE Right     Current Medications: Prior to Admission medications   Medication Sig Start Date End Date Taking? Authorizing Provider  albuterol (VENTOLIN HFA) 108 (90 Base) MCG/ACT inhaler as needed. 04/17/19   [provider]  amLODipine (NORVASC) 10 MG tablet Take 1 tablet (10 mg total) by mouth daily. 08/07/19 11/05/19  Netta Neat., NP  fluticasone Aleda Grana) 50 MCG/ACT nasal spray Place into both nostrils as needed for allergies or rhinitis.    [provider]  ibuprofen (ADVIL) 200 MG tablet Take 200 mg by mouth every 6 (six) hours as needed.    [provider]  omeprazole (PRILOSEC) 40 MG capsule Take 40 mg by mouth daily as needed.    [provider]    Allergies:   Septra [sulfamethoxazole-trimethoprim]   Social History   Socioeconomic History  . Marital status: Married    Spouse name: Not on file  . Number of children: Not on file  .  Years of education: Not on file  . Highest education level: Not on file  Occupational History  . Not on file  Tobacco Use  . Smoking status: Never Smoker  . Smokeless tobacco: Never Used  Vaping Use  . Vaping Use: Never used  Substance and Sexual Activity  . Alcohol use: Never    Alcohol/week: 0.0 standard drinks  . Drug use: No  . Sexual activity: Not on file  Other Topics Concern  . Not on file  Social History Narrative  . Not on file   Social Determinants of Health   Financial Resource Strain:   . Difficulty of Paying Living Expenses: Not on file    Food Insecurity:   . Worried About Programme researcher, broadcasting/film/video in the Last Year: Not on file  . Ran Out of Food in the Last Year: Not on file  Transportation Needs:   . Lack of Transportation (Medical): Not on file  . Lack of Transportation (Non-Medical): Not on file  Physical Activity:   . Days of Exercise per Week: Not on file  . Minutes of Exercise per Session: Not on file  Stress:   . Feeling of Stress : Not on file  Social Connections:   . Frequency of Communication with Friends and Family: Not on file  . Frequency of Social Gatherings with Friends and Family: Not on file  . Attends Religious Services: Not on file  . Active Member of Clubs or Organizations: Not on file  . Attends Banker Meetings: Not on file  . Marital Status: Not on file    Family History:  The patient's family history is not on file. He was adopted.   ROS:   Please see the history of present illness.    ROS All other systems reviewed and are negative.  PHYSICAL EXAM:    VS:  BP (!) 146/90   Pulse (!) 101   Ht 5' 6.5" (1.689 m)   Wt 246 lb 12.8 oz (111.9 kg)   SpO2 97%   BMI 39.24 kg/m    GEN: Well nourished, well developed, in no acute distress  HEENT: normal  Neck: no JVD, carotid bruits, or masses Cardiac: RRR; no murmurs, rubs, or gallops,no edema  Respiratory:  clear to auscultation bilaterally, normal work of breathing GI: soft, nontender, nondistended, + BS MS: no deformity or atrophy  Skin: warm and dry, no rash Neuro:  Alert and Oriented x 3, Strength and sensation are intact Psych: euthymic mood, full affect  Wt Readings from Last 3 Encounters:  05/07/20 246 lb 12.8 oz (111.9 kg)  10/12/19 241 lb 6.4 oz (109.5 kg)  08/23/19 236 lb 9.6 oz (107.3 kg)    Studies/Labs Reviewed:   EKG:  EKG is not  ordered today.    Recent Labs: 07/21/2019: BUN 18; Creatinine, Ser 0.85; Hemoglobin 15.6; Platelets 304; Potassium 4.2; Sodium 141; TSH 1.990   Lipid Panel    Component Value  Date/Time   CHOL 157 09/09/2017 1106   TRIG 104 09/09/2017 1106   HDL 48 09/09/2017 1106   CHOLHDL 3.3 09/09/2017 1106   CHOLHDL 3.8 08/15/2015 0907   VLDL 33 (H) 08/15/2015 0907   LDLCALC 88 09/09/2017 1106   Additional studies/ records that were reviewed today include:   CT angio of chest 07/19 IMPRESSION: 1. Mild aneurysmal dilatation of the ascending aorta, measuring up to 4.1 cm in diameter. This is stable from prior study in 2019. Recommend annual imaging followup by CTA or MRA.  This recommendation follows 2010 ACCF/AHA/AATS/ACR/ASA/SCA/SCAI/SIR/STS/SVM Guidelines for the Diagnosis and Management of Patients with Thoracic Aortic Disease. Circulation. 2010; 121: Z366-Y403. Aortic aneurysm NOS (ICD10-I71.9) 2. No acute pulmonary embolism identified.Dilated main pulmonary artery, nonspecific but can be seen with pulmonary arterial hypertension. 3. A 6 mm pulmonary nodule in the medial right upper lobe, outside the field of view on the patient's prior study. Non-contrast chest CT at 6-12 months is recommended. If the nodule is stable at time of repeat CT, then future CT at 18-24 months (from today's scan) is considered optional for low-risk patients, but is recommended for high-risk patients. This recommendation follows the consensus statement: Guidelines for Management of Incidental Pulmonary Nodules Detected on CT Images: From the Fleischner Society 2017; Radiology 2017; 284:228-243.  Echo 06/2019 1. Left ventricular ejection fraction, by visual estimation, is 60 to  65%. The left ventricle has normal function. There is no left ventricular  hypertrophy.  2. The left ventricle has no regional wall motion abnormalities.  3. Global right ventricle has mildly reduced systolic function.The right  ventricular size is mildly enlarged. Right vetricular wall thickness was  not assessed.  4. Left atrial size was normal.  5. Right atrial size was mildly dilated.  6. The  mitral valve is normal in structure. Trivial mitral valve  regurgitation.  7. The tricuspid valve is normal in structure.  8. The tricuspid valve is normal in structure. Tricuspid valve  regurgitation is trivial.  9. The aortic valve is tricuspid. Aortic valve regurgitation is not  visualized. No evidence of aortic valve sclerosis or stenosis.  10. The pulmonic valve was normal in structure. Pulmonic valve  regurgitation is trivial.  11. Aortic dilatation noted.  12. There is moderate dilatation of the ascending aorta measuring 45 mm.  13. TAA unchanged size from 2017.  14. Mildly dilated pulmonary artery.  15. Normal pulmonary artery systolic pressure.  16. Best view of PA shows size of 3.5 cm.  17. The atrial septum is grossly normal.   Coronary CT 01/2018 1. Coronary calcium score of 0. This was 0 percentile for age and sex matched control.  2. Normal coronary origin with right dominance.  3. No evidence of CAD.  4. Dilated pulmonary artery measuring 37 mm suggestive of pulmonary hypertension.    ASSESSMENT & PLAN:   Chronic dyspnea on exertion Reassuring cardiac work-up as summarized above.  His symptoms likely due to morbid obesity and inactivity.  He is explained the necessity of restarting his exercise regimen for at least 30 minutes 5 times a week.    Moderate to severe obstructive sleep apnea with desaturations at night -CPAP was recommended however patient is believe that he does not need it.  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  Hypertension -Recently stopped his amlodipine and lisinopril. -I will start him on valsartan 80 mg daily.  He is advised to send [name] blood pressure calendar.  Pulmonary nodule 6 mm large in the right upper lobe on most recent CT in February 2021, will repeat now.  Dilated ascending aorta - Stable 41 mm on recent CT of chest CT in February 2021, unchanged from  2019.  Medication Adjustments/Labs and Tests Ordered: Current medicines are reviewed at length with the patient today.  Concerns regarding medicines are outlined above.  Medication changes, Labs and Tests ordered today are listed in the Patient Instructions below. Patient Instructions  Medication Instructions:   STOP TAKING AMLODIPINE  NOW  START TAKING VALSARTAN 160 MG BY MOUTH DAILY  *If you need a refill on your cardiac medications before your next appointment, please call your pharmacy*   Lab Work:  TODAY--CMET, CBC, TSH, AND LIPIDS  If you have labs (blood work) drawn today and your tests are completely normal, you will receive your results only by: Marland Kitchen. MyChart Message (if you have MyChart) OR . A paper copy in the mail If you have any lab test that is abnormal or we need to change your treatment, we will call you to review the results.   Testing/Procedures:  CHEST CT WITHOUT CONTRAST--NEEDS TO BE SCHEDULED NOW Non-Cardiac CT scanning, (CAT scanning), is a noninvasive, special x-ray that produces cross-sectional images of the body using x-rays and a computer. CT scans help physicians diagnose and treat medical conditions. For some CT exams, a contrast material is used to enhance visibility in the area of the body being studied. CT scans provide greater clarity and reveal more details than regular x-ray exams.   Follow-Up:  3 MONTHS IN THE OFFICE WITH AN EXTENDER      Signed, Tobias AlexanderKatarina Zavior Thomason, MD  05/07/2020 2:54 PM    [facility] [address], [place], [place]  [address] Phone: [phone]; Fax: [phone]

## 2020-05-07 NOTE — Patient Instructions (Signed)
Medication Instructions:   STOP TAKING AMLODIPINE NOW  START TAKING VALSARTAN 160 MG BY MOUTH DAILY  *If you need a refill on your cardiac medications before your next appointment, please call your pharmacy*   Lab Work:  TODAY--CMET, CBC, TSH, AND LIPIDS  If you have labs (blood work) drawn today and your tests are completely normal, you will receive your results only by: Marland Kitchen MyChart Message (if you have MyChart) OR . A paper copy in the mail If you have any lab test that is abnormal or we need to change your treatment, we will call you to review the results.   Testing/Procedures:  CHEST CT WITHOUT CONTRAST--NEEDS TO BE SCHEDULED NOW Non-Cardiac CT scanning, (CAT scanning), is a noninvasive, special x-ray that produces cross-sectional images of the body using x-rays and a computer. CT scans help physicians diagnose and treat medical conditions. For some CT exams, a contrast material is used to enhance visibility in the area of the body being studied. CT scans provide greater clarity and reveal more details than regular x-ray exams.   Follow-Up:  3 MONTHS IN THE OFFICE WITH AN EXTENDER

## 2020-05-08 ENCOUNTER — Telehealth: Payer: Self-pay | Admitting: Cardiology

## 2020-05-08 MED ORDER — ROSUVASTATIN CALCIUM 5 MG PO TABS: 5.0000 mg | ORAL_TABLET | Freq: Every day | ORAL | 3 refills | Status: AC

## 2020-05-08 NOTE — Telephone Encounter (Signed)
     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

## 2020-05-08 NOTE — Telephone Encounter (Signed)
Left the pt a detailed message, as he requested me to, informing him that per Dr. Delton See, all his labs were normal except for elevated cholesterol, and being he has ascending aortic aneurysm, we need to start him on rosuvastatin 5 mg po daily. Left him a detailed message informing him that I will send this medicine to his pharmacy on file.  Advised the pt to call the office back with any further questions or concerns regarding this.

## 2020-05-08 NOTE — Telephone Encounter (Signed)
-----   Message from Lars Masson, MD sent at 05/08/2020  8:15 AM EST ----- The patient has all labs normal except for elevated cholesterol, together with ascending aortic aneurysm I would start him on Crestor 5 mg daily.

## 2020-05-10 DIAGNOSIS — Z23 Encounter for immunization: Secondary | ICD-10-CM | POA: Diagnosis not present

## 2020-05-10 DIAGNOSIS — Z125 Encounter for screening for malignant neoplasm of prostate: Secondary | ICD-10-CM | POA: Diagnosis not present

## 2020-05-10 DIAGNOSIS — R7303 Prediabetes: Secondary | ICD-10-CM | POA: Diagnosis not present

## 2020-05-10 DIAGNOSIS — E782 Mixed hyperlipidemia: Secondary | ICD-10-CM | POA: Diagnosis not present

## 2020-05-10 DIAGNOSIS — Z0000 Encounter for general adult medical examination without abnormal findings: Secondary | ICD-10-CM | POA: Diagnosis not present

## 2020-05-10 DIAGNOSIS — I10 Essential (primary) hypertension: Secondary | ICD-10-CM | POA: Diagnosis not present

## 2020-05-16 ENCOUNTER — Other Ambulatory Visit: Payer: Self-pay

## 2020-05-16 ENCOUNTER — Ambulatory Visit (INDEPENDENT_AMBULATORY_CARE_PROVIDER_SITE_OTHER)
Admission: RE | Admit: 2020-05-16 | Discharge: 2020-05-16 | Disposition: A | Discharge status: Home / Self Care | Payer: BC Managed Care – PPO | Source: Ambulatory Visit | Attending: Cardiology | Admitting: Cardiology

## 2020-05-16 DIAGNOSIS — R911 Solitary pulmonary nodule: Secondary | ICD-10-CM | POA: Diagnosis not present

## 2020-05-16 DIAGNOSIS — R918 Other nonspecific abnormal finding of lung field: Secondary | ICD-10-CM | POA: Diagnosis not present

## 2020-05-17 ENCOUNTER — Inpatient Hospital Stay: Admission: RE | Admit: 2020-05-17 | Payer: BC Managed Care – PPO | Source: Ambulatory Visit

## 2020-05-20 ENCOUNTER — Telehealth: Payer: Self-pay | Admitting: *Deleted

## 2020-05-20 DIAGNOSIS — R911 Solitary pulmonary nodule: Secondary | ICD-10-CM

## 2020-05-20 DIAGNOSIS — I77810 Thoracic aortic ectasia: Secondary | ICD-10-CM

## 2020-05-20 NOTE — Telephone Encounter (Signed)
Pt made aware of Chest CT results and recommendations per Dr. Delton See and reading Radiologist, for him to have another repeat Chest CT WO Contrast to be done in 18-24 months, to make sure 6 mm pulmonary nodule remains the same size.  Informed the pt that I will go ahead and place the order for him to have this repeated in 18-24 months from now, and a CT Scheduler from our office will call him back to arrange this appt.  Pt verbalized understanding and agrees with this plan.

## 2020-05-20 NOTE — Telephone Encounter (Signed)
-----   Message from Lars Masson, MD sent at 05/18/2020 12:31 PM EST ----- Stable, 6 mm pulmonary nodule of the medial posterior right upper lobe. Additional follow-up CT at 18-24 months (from baseline scan) is considered optional for low-risk patients, but is recommended for high-risk patients.  3. Unchanged enlargement of the tubular ascending thoracic aorta, measuring up to 4.3 x 4.2 cm.

## 2020-06-07 DIAGNOSIS — N481 Balanitis: Secondary | ICD-10-CM | POA: Diagnosis not present

## 2020-06-13 ENCOUNTER — Other Ambulatory Visit: Payer: Self-pay | Admitting: Urology

## 2020-07-23 ENCOUNTER — Encounter (HOSPITAL_BASED_OUTPATIENT_CLINIC_OR_DEPARTMENT_OTHER): Payer: Self-pay | Admitting: Urology

## 2020-07-24 ENCOUNTER — Other Ambulatory Visit (HOSPITAL_COMMUNITY)
Admission: RE | Admit: 2020-07-24 | Discharge: 2020-07-24 | Disposition: A | Discharge status: Home / Self Care | Payer: BC Managed Care – PPO | Source: Ambulatory Visit | Attending: Internal Medicine | Admitting: Internal Medicine

## 2020-07-24 ENCOUNTER — Encounter (HOSPITAL_BASED_OUTPATIENT_CLINIC_OR_DEPARTMENT_OTHER): Payer: Self-pay | Admitting: Urology

## 2020-07-24 ENCOUNTER — Other Ambulatory Visit: Payer: Self-pay

## 2020-07-24 DIAGNOSIS — Z01812 Encounter for preprocedural laboratory examination: Secondary | ICD-10-CM | POA: Insufficient documentation

## 2020-07-24 DIAGNOSIS — Z8616 Personal history of COVID-19: Secondary | ICD-10-CM | POA: Diagnosis not present

## 2020-07-24 DIAGNOSIS — Z96651 Presence of right artificial knee joint: Secondary | ICD-10-CM | POA: Diagnosis not present

## 2020-07-24 DIAGNOSIS — N4883 Acquired buried penis: Secondary | ICD-10-CM | POA: Diagnosis not present

## 2020-07-24 DIAGNOSIS — Z20822 Contact with and (suspected) exposure to covid-19: Secondary | ICD-10-CM | POA: Insufficient documentation

## 2020-07-24 DIAGNOSIS — N471 Phimosis: Secondary | ICD-10-CM | POA: Diagnosis not present

## 2020-07-24 DIAGNOSIS — Z87891 Personal history of nicotine dependence: Secondary | ICD-10-CM | POA: Diagnosis not present

## 2020-07-24 LAB — SARS CORONAVIRUS 2 (TAT 6-24 HRS): SARS Coronavirus 2: NEGATIVE

## 2020-07-24 NOTE — Progress Notes (Signed)
Spoke w/ via phone for pre-op interview--- PT Lab needs dos---- Istat              Lab results------ current ekg in epic/ chart COVID test ------ done 07-24-2020 result in epic Arrive at -------  1015 on 07-26-2020 NPO after MN NO Solid Food.  Clear liquids from MN until--- 0915 Medications to take morning of surgery ----- Prilosec Diabetic medication ----- n/a Patient Special Instructions ----- n/a Pre-Op special Istructions ----- n/a Patient verbalized understanding of instructions that were given at this phone interview. Patient denies shortness of breath, chest pain, fever, cough at this phone interview.   Anesthesia :  HTN;  Ascending aorta dilatation , 4.3 cm;  Pulmonary artery dilatation , 4.0cm;  Stable pulm. Nodules;  Moderate - severe OSA, no cpap (pt choice);    PCP:  Dr Hyman Hopes Cardiologist :  Dr Eloy End (lov 05-07-2020 epic) Chest x-ray :  Chest CT , 04-15-2020 epic EKG :  05-07-2020 epic Echo :  07-28-2019 epic Stress test:  08-22-2015 epic Cardiac Cath :   no Activity level:  Denies sob w/ any activity Sleep Study/ CPAP :  YES/  NO Fasting Blood Sugar :      / Checks Blood Sugar -- times a day:  N/A Blood Thinner/ Instructions Maurice Small Dose:  NO ASA / Instructions/ Last Dose : NO

## 2020-07-26 ENCOUNTER — Other Ambulatory Visit: Payer: Self-pay

## 2020-07-26 ENCOUNTER — Ambulatory Visit (HOSPITAL_BASED_OUTPATIENT_CLINIC_OR_DEPARTMENT_OTHER)
Admission: RE | Admit: 2020-07-26 | Discharge: 2020-07-26 | Disposition: A | Discharge status: Home / Self Care | Payer: BC Managed Care – PPO | Attending: Urology | Admitting: Urology

## 2020-07-26 ENCOUNTER — Encounter (HOSPITAL_BASED_OUTPATIENT_CLINIC_OR_DEPARTMENT_OTHER)
Admission: RE | Disposition: A | Discharge status: Home / Self Care | Payer: Self-pay | Source: Home / Self Care | Attending: Urology

## 2020-07-26 ENCOUNTER — Ambulatory Visit (HOSPITAL_BASED_OUTPATIENT_CLINIC_OR_DEPARTMENT_OTHER): Payer: BC Managed Care – PPO | Admitting: Certified Registered"

## 2020-07-26 ENCOUNTER — Encounter (HOSPITAL_BASED_OUTPATIENT_CLINIC_OR_DEPARTMENT_OTHER): Payer: Self-pay | Admitting: Urology

## 2020-07-26 DIAGNOSIS — N481 Balanitis: Secondary | ICD-10-CM | POA: Diagnosis not present

## 2020-07-26 DIAGNOSIS — Z96651 Presence of right artificial knee joint: Secondary | ICD-10-CM | POA: Diagnosis not present

## 2020-07-26 DIAGNOSIS — E119 Type 2 diabetes mellitus without complications: Secondary | ICD-10-CM | POA: Diagnosis not present

## 2020-07-26 DIAGNOSIS — N471 Phimosis: Secondary | ICD-10-CM | POA: Insufficient documentation

## 2020-07-26 DIAGNOSIS — Z20822 Contact with and (suspected) exposure to covid-19: Secondary | ICD-10-CM | POA: Insufficient documentation

## 2020-07-26 DIAGNOSIS — I10 Essential (primary) hypertension: Secondary | ICD-10-CM | POA: Diagnosis not present

## 2020-07-26 DIAGNOSIS — Z87891 Personal history of nicotine dependence: Secondary | ICD-10-CM | POA: Diagnosis not present

## 2020-07-26 DIAGNOSIS — Z8616 Personal history of COVID-19: Secondary | ICD-10-CM | POA: Diagnosis not present

## 2020-07-26 DIAGNOSIS — N4883 Acquired buried penis: Secondary | ICD-10-CM | POA: Diagnosis not present

## 2020-07-26 HISTORY — DX: Presence of dental prosthetic device (complete) (partial): Z97.2

## 2020-07-26 HISTORY — PX: CIRCUMCISION: SHX1350

## 2020-07-26 HISTORY — DX: Balanitis: N48.1

## 2020-07-26 HISTORY — DX: Gastro-esophageal reflux disease without esophagitis: K21.9

## 2020-07-26 HISTORY — DX: Umbilical hernia without obstruction or gangrene: K42.9

## 2020-07-26 HISTORY — DX: Phimosis: N47.1

## 2020-07-26 HISTORY — DX: Prediabetes: R73.03

## 2020-07-26 LAB — POCT I-STAT, CHEM 8
BUN: 21 mg/dL — ABNORMAL HIGH (ref 6–20)
BUN: 15 mg/dL (ref 6–20)
Calcium, Ion: 1.19 mmol/L (ref 1.15–1.40)
Calcium, Ion: 1.21 mmol/L (ref 1.15–1.40)
Chloride: 102 mmol/L (ref 98–111)
Chloride: 103 mmol/L (ref 98–111)
Creatinine, Ser: 0.7 mg/dL (ref 0.61–1.24)
Creatinine, Ser: 0.7 mg/dL (ref 0.61–1.24)
Glucose, Bld: 98 mg/dL (ref 70–99)
Glucose, Bld: 92 mg/dL (ref 70–99)
HCT: 46 % (ref 39.0–52.0)
HCT: 48 % (ref 39.0–52.0)
Hemoglobin: 15.6 g/dL (ref 13.0–17.0)
Hemoglobin: 16.3 g/dL (ref 13.0–17.0)
Potassium: 5.9 mmol/L — ABNORMAL HIGH (ref 3.5–5.1)
Potassium: 4 mmol/L (ref 3.5–5.1)
Sodium: 140 mmol/L (ref 135–145)
Sodium: 140 mmol/L (ref 135–145)
TCO2: 28 mmol/L (ref 22–32)
TCO2: 25 mmol/L (ref 22–32)

## 2020-07-26 SURGERY — CIRCUMCISION, ADULT
Anesthesia: General | Site: Penis

## 2020-07-26 MED ORDER — TRAMADOL HCL 50 MG PO TABS: 50.0000 mg | ORAL_TABLET | Freq: Four times a day (QID) | ORAL | 0 refills | Status: AC | PRN

## 2020-07-26 MED ORDER — PROMETHAZINE HCL 25 MG/ML IJ SOLN: 6.2500 mg | INTRAMUSCULAR | Status: DC | PRN

## 2020-07-26 MED ORDER — OXYCODONE HCL 5 MG/5ML PO SOLN: 5.0000 mg | Freq: Once | ORAL | Status: DC | PRN

## 2020-07-26 MED ORDER — FENTANYL CITRATE (PF) 100 MCG/2ML IJ SOLN
INTRAMUSCULAR | Status: DC | PRN
  Administered 2020-07-26 (×3): 50 ug via INTRAVENOUS

## 2020-07-26 MED ORDER — CEFAZOLIN SODIUM-DEXTROSE 2-4 GM/100ML-% IV SOLN
INTRAVENOUS | Status: AC
  Filled 2020-07-26: qty 100

## 2020-07-26 MED ORDER — LIDOCAINE HCL (PF) 2 % IJ SOLN
INTRAMUSCULAR | Status: AC
  Filled 2020-07-26: qty 10

## 2020-07-26 MED ORDER — PROPOFOL 10 MG/ML IV BOLUS
INTRAVENOUS | Status: AC
  Filled 2020-07-26: qty 20

## 2020-07-26 MED ORDER — EPHEDRINE SULFATE-NACL 50-0.9 MG/10ML-% IV SOSY
PREFILLED_SYRINGE | INTRAVENOUS | Status: DC | PRN
  Administered 2020-07-26: 10 mg via INTRAVENOUS

## 2020-07-26 MED ORDER — PROPOFOL 10 MG/ML IV BOLUS
INTRAVENOUS | Status: DC | PRN
  Administered 2020-07-26: 200 mg via INTRAVENOUS

## 2020-07-26 MED ORDER — FENTANYL CITRATE (PF) 100 MCG/2ML IJ SOLN: 25.0000 ug | INTRAMUSCULAR | Status: DC | PRN

## 2020-07-26 MED ORDER — MIDAZOLAM HCL 2 MG/2ML IJ SOLN
INTRAMUSCULAR | Status: AC
  Filled 2020-07-26: qty 2

## 2020-07-26 MED ORDER — OXYCODONE HCL 5 MG PO TABS: 5.0000 mg | ORAL_TABLET | Freq: Once | ORAL | Status: DC | PRN

## 2020-07-26 MED ORDER — CEFAZOLIN SODIUM-DEXTROSE 2-4 GM/100ML-% IV SOLN
2.0000 g | Freq: Once | INTRAVENOUS | Status: AC
  Administered 2020-07-26: 2 g via INTRAVENOUS

## 2020-07-26 MED ORDER — DEXAMETHASONE SODIUM PHOSPHATE 10 MG/ML IJ SOLN
INTRAMUSCULAR | Status: DC | PRN
  Administered 2020-07-26: 10 mg via INTRAVENOUS

## 2020-07-26 MED ORDER — MIDAZOLAM HCL 5 MG/5ML IJ SOLN
INTRAMUSCULAR | Status: DC | PRN
  Administered 2020-07-26: 2 mg via INTRAVENOUS

## 2020-07-26 MED ORDER — ONDANSETRON HCL 4 MG/2ML IJ SOLN
INTRAMUSCULAR | Status: DC | PRN
  Administered 2020-07-26: 4 mg via INTRAVENOUS

## 2020-07-26 MED ORDER — FENTANYL CITRATE (PF) 250 MCG/5ML IJ SOLN
INTRAMUSCULAR | Status: AC
  Filled 2020-07-26: qty 5

## 2020-07-26 MED ORDER — LIDOCAINE 2% (20 MG/ML) 5 ML SYRINGE
INTRAMUSCULAR | Status: DC | PRN
  Administered 2020-07-26: 100 mg via INTRAVENOUS

## 2020-07-26 MED ORDER — PHENYLEPHRINE 40 MCG/ML (10ML) SYRINGE FOR IV PUSH (FOR BLOOD PRESSURE SUPPORT)
PREFILLED_SYRINGE | INTRAVENOUS | Status: DC | PRN
  Administered 2020-07-26: 120 ug via INTRAVENOUS
  Administered 2020-07-26 (×2): 80 ug via INTRAVENOUS
  Administered 2020-07-26: 120 ug via INTRAVENOUS

## 2020-07-26 MED ORDER — BUPIVACAINE HCL 0.5 % IJ SOLN
INTRAMUSCULAR | Status: DC | PRN
  Administered 2020-07-26: 6 mL

## 2020-07-26 MED ORDER — LACTATED RINGERS IV SOLN: INTRAVENOUS | Status: DC

## 2020-07-26 SURGICAL SUPPLY — 28 items
BLADE SURG 15 STRL LF DISP TIS (BLADE) ×1 IMPLANT
BLADE SURG 15 STRL SS (BLADE) ×2
BNDG COHESIVE 2X5 TAN NS LF (GAUZE/BANDAGES/DRESSINGS) ×2 IMPLANT
BNDG COHESIVE 2X5 TAN STRL LF (GAUZE/BANDAGES/DRESSINGS) ×2 IMPLANT
BNDG CONFORM 2 STRL LF (GAUZE/BANDAGES/DRESSINGS) ×2 IMPLANT
COVER BACK TABLE 60X90IN (DRAPES) ×2 IMPLANT
COVER MAYO STAND STRL (DRAPES) ×2 IMPLANT
COVER WAND RF STERILE (DRAPES) ×2 IMPLANT
DECANTER SPIKE VIAL GLASS SM (MISCELLANEOUS) ×2 IMPLANT
DRAPE LAPAROTOMY 100X72 PEDS (DRAPES) ×2 IMPLANT
ELECT NEEDLE TIP 2.8 STRL (NEEDLE) ×2 IMPLANT
ELECT REM PT RETURN 9FT ADLT (ELECTROSURGICAL) ×2
ELECTRODE REM PT RTRN 9FT ADLT (ELECTROSURGICAL) ×1 IMPLANT
GAUZE PETROLATUM 1 X8 (GAUZE/BANDAGES/DRESSINGS) ×2 IMPLANT
GLOVE SURG ENC TEXT LTX SZ7.5 (GLOVE) ×2 IMPLANT
GLOVE SURG UNDER POLY LF SZ7.5 (GLOVE) ×2 IMPLANT
GOWN STRL REUS W/TWL LRG LVL3 (GOWN DISPOSABLE) ×2 IMPLANT
KIT TURNOVER CYSTO (KITS) ×2 IMPLANT
NEEDLE HYPO 25X1 1.5 SAFETY (NEEDLE) ×2 IMPLANT
NS IRRIG 500ML POUR BTL (IV SOLUTION) ×2 IMPLANT
PACK BASIN DAY SURGERY FS (CUSTOM PROCEDURE TRAY) ×2 IMPLANT
PENCIL SMOKE EVACUATOR (MISCELLANEOUS) ×2 IMPLANT
SUT CHROMIC 3 0 SH 27 (SUTURE) ×4 IMPLANT
SUT VIC AB 4-0 PS2 18 (SUTURE) IMPLANT
SYR CONTROL 10ML LL (SYRINGE) IMPLANT
TOWEL OR 17X26 10 PK STRL BLUE (TOWEL DISPOSABLE) ×2 IMPLANT
TRAY DSU PREP LF (CUSTOM PROCEDURE TRAY) ×2 IMPLANT
WATER STERILE IRR 500ML POUR (IV SOLUTION) IMPLANT

## 2020-07-26 NOTE — Anesthesia Postprocedure Evaluation (Signed)
Anesthesia Post Note  Patient: Cody Villegas  Procedure(s) Performed: CIRCUMCISION ADULT (N/A Penis)     Patient location during evaluation: PACU Anesthesia Type: General Level of consciousness: awake Pain management: pain level controlled Vital Signs Assessment: post-procedure vital signs reviewed and stable Respiratory status: spontaneous breathing and respiratory function stable Cardiovascular status: stable Postop Assessment: no apparent nausea or vomiting Anesthetic complications: no   No complications documented.  Last Vitals:  Vitals:   07/26/20 1345 07/26/20 1355  BP: 130/79 121/79  Pulse: 95 83  Resp: 15 13  Temp:  36.7 C  SpO2: 93% 95%    Last Pain:  Vitals:   07/26/20 1345  TempSrc:   PainSc: 0-No pain                 Mellody Dance

## 2020-07-26 NOTE — Anesthesia Preprocedure Evaluation (Addendum)
Anesthesia Evaluation  Patient identified by MRN, date of birth, ID band Patient awake    Reviewed: Allergy & Precautions, NPO status , Patient's Chart, lab work & pertinent test results  Airway Mallampati: II  TM Distance: >3 FB Neck ROM: Full    Dental   Partials:   Pulmonary sleep apnea (mod-severe; does not use cpap) , former smoker,  + covid 02/2020, still no taste/small   Pulmonary exam normal breath sounds clear to auscultation       Cardiovascular Exercise Tolerance: Good hypertension,  Rhythm:Regular Rate:Normal  Ascending aorta dilatation  ECHO 06/2019 1. Left ventricular ejection fraction, by visual estimation, is 60 to  65%. The left ventricle has normal function. There is no left ventricular  hypertrophy.  2. The left ventricle has no regional wall motion abnormalities.  3. Global right ventricle has mildly reduced systolic function.The right  ventricular size is mildly enlarged. Right vetricular wall thickness was  not assessed.  4. Left atrial size was normal.  5. Right atrial size was mildly dilated.  6. The mitral valve is normal in structure. Trivial mitral valve  regurgitation.  7. The tricuspid valve is normal in structure.  8. The tricuspid valve is normal in structure. Tricuspid valve  regurgitation is trivial.  9. The aortic valve is tricuspid. Aortic valve regurgitation is not  visualized. No evidence of aortic valve sclerosis or stenosis.  10. The pulmonic valve was normal in structure. Pulmonic valve  regurgitation is trivial.  11. Aortic dilatation noted.  12. There is moderate dilatation of the ascending aorta measuring 45 mm.  13. TAA unchanged size from 2017.  14. Mildly dilated pulmonary artery.  15. Normal pulmonary artery systolic pressure.  16. Best view of PA shows size of 3.5 cm.  17. The atrial septum is grossly normal.    Neuro/Psych negative neurological ROS  negative  psych ROS   GI/Hepatic Neg liver ROS, GERD  ,  Endo/Other  diabetes (prediabetes)  Renal/GU negative Renal ROS  negative genitourinary   Musculoskeletal negative musculoskeletal ROS (+)   Abdominal (+) + obese,   Peds negative pediatric ROS (+)  Hematology negative hematology ROS (+)   Anesthesia Other Findings   Reproductive/Obstetrics negative OB ROS                            Anesthesia Physical Anesthesia Plan  ASA: III  Anesthesia Plan: General   Post-op Pain Management:    Induction: Intravenous  PONV Risk Score and Plan: 2  Airway Management Planned: Oral ETT  Additional Equipment:   Intra-op Plan:   Post-operative Plan: Extubation in OR  Informed Consent: I have reviewed the patients History and Physical, chart, labs and discussed the procedure including the risks, benefits and alternatives for the proposed anesthesia with the patient or authorized representative who has indicated his/her understanding and acceptance.     Dental advisory given  Plan Discussed with: Anesthesiologist and CRNA  Anesthesia Plan Comments: (K+ elevated on istat at 5.9. Will repeat istat and send BMP in the meantime. Tanna Furry, MD  )      Anesthesia Quick Evaluation

## 2020-07-26 NOTE — H&P (Signed)
Office Visit Report     06/07/2020   --------------------------------------------------------------------------------   Cody Villegas. Cody Villegas  MRN: [id number]  DOB: 05/14/1961, 60 year old Male   PRIMARY CARE:  [facility]  REFERRING:  Shirlean Mylar, MD  PROVIDER:  Rhoderick Moody, M.D.  LOCATION:  [facility] [phone]     --------------------------------------------------------------------------------   CC: I have irritation and discomfort of the head of my penis.  HPI: Cody Villegas is a 60 year-old male patient who was referred by Dr. Shirlean Mylar, MD who is here for irritation and discomfort of the head of his penis.  He first stated noticing pain on approximately 11/28/2019. His symptoms did begin gradually. His symptoms have been stable over the last year.   He was not circumcised at birth. He would like to be circumcised. He does have difficulty retracting his foreskin. He has had to use creams on the head of his penis. He does not have diabetes.   He is not currently having trouble urinating.   -He reports a several month history of penile foreskin irritation despite nystatin cream. Unsure if he has used a topical steroid. Admits that he will occasionally pour alcohol on his penis due to itching.      ALLERGIES: No Allergies    MEDICATIONS: Ibuprofen CAPS Oral  Nystatin  Omeprazole 40 mg capsule,delayed release Oral  Proair Hfa     GU PSH: Vasectomy - 2008       PSH Notes: Hand Surgery, Appendectomy, Surgery Of Male Genitalia Vasectomy   NON-GU PSH: Appendectomy - 2008 Knee replacement, Right     GU PMH: Balanitis, Balanitis - 2016 Encounter for Prostate Cancer screening, Prostate cancer screening - 2016 Oth disorders of prepuce, Phimosis/redundant prepuce - 2016    NON-GU PMH: Personal history of other diseases of the nervous system and sense organs, History of sleep apnea - 2014 Personal history of other specified conditions,  History of heartburn - 2014 Encounter for general adult medical examination without abnormal findings, Encounter for preventive health examination GERD Hypercholesterolemia Hypertension    FAMILY HISTORY: Cancer - Father Death In The Family Father - Father Death In The Family Mother - Mother Family Health Status Number - Runs In Family   SOCIAL HISTORY: Marital Status: Married Preferred Language: English Current Smoking Status: Patient does not smoke anymore. Has not smoked since 05/29/1990. Smoked for 2 years. Smoked less than 1/2 pack per day.   Tobacco Use Assessment Completed: Used Tobacco in last 30 days? Does not drink anymore.  Drinks 3 caffeinated drinks per day.     Notes: Never a smoker, Number of children, Caffeine Use, Alcohol Use, Marital History - Currently Married, Occupation:, Tobacco Use   REVIEW OF SYSTEMS:    GU Review Male:   Patient reports get up at night to urinate and penile pain. Patient denies frequent urination, hard to postpone urination, burning/ pain with urination, leakage of urine, stream starts and stops, trouble starting your stream, have to strain to urinate , and erection problems.  Gastrointestinal (Upper):   Patient denies nausea, vomiting, and indigestion/ heartburn.  Gastrointestinal (Lower):   Patient denies diarrhea and constipation.  Constitutional:   Patient denies fever, night sweats, weight loss, and fatigue.  Skin:   Patient denies itching and skin rash/ lesion.  Eyes:   Patient denies blurred vision and double vision.  Ears/ Nose/ Throat:   Patient denies sore throat and sinus problems.  Hematologic/Lymphatic:   Patient denies swollen glands and easy  bruising.  Cardiovascular:   Patient denies leg swelling and chest pains.  Respiratory:   Patient denies cough and shortness of breath.  Endocrine:   Patient denies excessive thirst.  Musculoskeletal:   Patient denies back pain and joint pain.  Neurological:   Patient denies headaches and  dizziness.  Psychologic:   Patient denies depression and anxiety.   Notes: Nocturia 1-2x per night. Pt c/o painful intercourse.    VITAL SIGNS:      06/07/2020 09:34 AM  Weight 240 lb / 108.86 kg  Height 66 in / 167.64 cm  BP 161/92 mmHg  Heart Rate 76 /min  Temperature 98.2 F / 36.7 C  BMI 38.7 kg/m   GU PHYSICAL EXAMINATION:    Scrotum: No lesions. No edema. No cysts. No warts.  Epididymides: Right: no spermatocele, no masses, no cysts, no tenderness, no induration, no enlargement. Left: no spermatocele, no masses, no cysts, no tenderness, no induration, no enlargement.  Testes: No tenderness, no swelling, no enlargement left testes. No tenderness, no swelling, no enlargement right testes. Normal location left testes. Normal location right testes. No mass, no cyst, no varicocele, no hydrocele left testes. No mass, no cyst, no varicocele, no hydrocele right testes.  Urethral Meatus: Normal size. No lesion, no wart, no discharge, no polyp. Normal location.  Penis: Penis uncircumcised, cracks and fissures. No foreskin warts. No dorsal peyronie's plaques, no left corporal peyronie's plaques, no right corporal peyronie's plaques, no scarring, no shaft warts. No balanitis, no meatal stenosis.    MULTI-SYSTEM PHYSICAL EXAMINATION:       Complexity of Data:   10/19/14  PSA  Total PSA 0.86     PROCEDURES: None   ASSESSMENT:      ICD-10 Details  1 GU:   Balanitis - N48.1 Undiagnosed New Problem   PLAN:            Medications New Meds: Triamcinolone Acetonide 0.1 % ointment Apply to penile shaft skin PRN   #30  1 Refill(s)            Schedule Return Visit/Planned Activity: Next Available Appointment - Schedule Surgery          Document Letter(s):  Created for Patient: Clinical Summary         Notes:   -The risks, benefits and alternatives of adult circumcision was discussed with the patient. The risks included, but are not limited to, bleeding, infection, pain, MI, CVA,  DVT, PE and the inherent risks of general anesthesia.

## 2020-07-26 NOTE — Transfer of Care (Signed)
Immediate Anesthesia Transfer of Care Note  Patient: Cody Villegas  Procedure(s) Performed: CIRCUMCISION ADULT (N/A Penis)  Patient Location: PACU  Anesthesia Type:General  Level of Consciousness: awake, alert , oriented and patient cooperative  Airway & Oxygen Therapy: Patient Spontanous Breathing and nasal cannula  Post-op Assessment: Report given to RN and Post -op Vital signs reviewed and stable  Post vital signs: Reviewed and stable  Last Vitals:  Vitals Value Taken Time  BP 131/81 07/26/20 1325  Temp 36.2 C 07/26/20 1325  Pulse 93 07/26/20 1328  Resp 16 07/26/20 1328  SpO2 95 % 07/26/20 1328  Vitals shown include unvalidated device data.  Last Pain:  Vitals:   07/26/20 1049  TempSrc: Oral  PainSc: 0-No pain      Patients Stated Pain Goal: 5 (07/26/20 1049)  Complications: No complications documented.

## 2020-07-26 NOTE — Op Note (Signed)
Operative Note  Preoperative diagnosis:  1.  Phimosis  Postoperative diagnosis: 1.  Phimosis  Procedure(s): 1.  Adult Circumcision  Surgeon: Rhoderick Moody, MD  Assistants:  None  Anesthesia:  General  Complications:  None  EBL:  5 mL  Specimens: 1. Foreskin  Drains/Catheters: 1.  None  Intraoperative findings:   1. Phimosis with slightly buried penis  Indication:  Cody Villegas is a 60 y.o. male with phimosis of the penile foreskin.  He has been consented for the above procedures, voices understanding and wishes to proceed.  Description of procedure:  After informed consent was obtained, the patient was brought to the operating room and general LMA anesthesia was administered.  The patient was prepped and draped in the usual fashion.  A timeout was then performed.   Circumferential marks were then made with the first being with the foreskin in the anatomic position along the glandular impression on the second being with the foreskin retracted approximately 1 cm from the coronal sulcus.  The marks were then incised using electrocautery and the excess foreskin was excised.  The penile shaft skin was then reapproximated using interrupted 3-0 chromic suture.  A penile ring block was then performed using quarter percent Marcaine without epinephrine.  The penis was then dressed triple anti-biotic ointment.  The patient tolerated the procedure well and was transferred to the postanesthesia unit in stable condition.  Plan:  D/c home

## 2020-07-26 NOTE — Interval H&P Note (Signed)
History and Physical Interval Note:  07/26/2020 12:17 PM  [name]  has presented today for surgery, with the diagnosis of PHIMOSIS, BALANITIS.  The various methods of treatment have been discussed with the patient and family. After consideration of risks, benefits and other options for treatment, the patient has consented to  Procedure(s) with comments: CIRCUMCISION ADULT (N/A) - ONLY NEEDS 45 MIN as a surgical intervention.  The patient's history has been reviewed, patient examined, no change in status, stable for surgery.  I have reviewed the patient's chart and labs.  Questions were answered to the patient's satisfaction.     Cody Villegas

## 2020-07-26 NOTE — Anesthesia Procedure Notes (Signed)
Procedure Name: LMA Insertion Date/Time: 07/26/2020 12:27 PM Performed by: Bishop Limbo, CRNA Pre-anesthesia Checklist: Patient identified, Emergency Drugs available, Suction available and Patient being monitored Patient Re-evaluated:Patient Re-evaluated prior to induction Oxygen Delivery Method: Circle System Utilized Preoxygenation: Pre-oxygenation with 100% oxygen Induction Type: IV induction Ventilation: Mask ventilation without difficulty LMA: LMA inserted LMA Size: 4.0 Number of attempts: 1 Placement Confirmation: positive ETCO2 Tube secured with: Tape Dental Injury: Teeth and Oropharynx as per pre-operative assessment

## 2020-07-26 NOTE — Discharge Instructions (Signed)
Circumcision-Home Care Instructions  The following instructions have been prepared to help you care for yourself upon your return home today.   Wound Care & Hygiene:   You may apply ice to the penis.  This may help to decrease swelling.  Remove the dressing tomorrow.  If the dressing falls off before then, leave it off.  You may shower or bathe in 48 hours  Gently wash the penis with soap and water.  The stitches do not need to be removed.  Activity:  Do not drive or operate any equipment today.  The effects of anesthesia are still present, drowsiness may result.  Rest today, not necessarily flat bed rest, just take it easy.  You may resume your normal activity in one to two days or as indicated by your physician.  Sexual Activity:  Erection and sexual relations should be avoided for 2 weeks.  Return to Work:  One to two days or as indicated by your physician   Diet:  Drink liquids or eat a very light diet this evening.  You may resume a regular diet tomorrow.  General Expectations of your surgery:   You may have a small amount of bleeding  The penis will be swollen and bruised for approximately one week  You may wake during the night with an erection, usually this is caused by having a full bladder so you should try to urinate (pass your water) to relieve the erection or apply ice to the penis  Unexpected Observations - Call your doctor if these occur!  Persistent or heavy bleeding  Temperature of 101 degrees or more  Severe pain not relieved by [facility]  The following instructions have been prepared to help you care for yourself upon your return home today.   Wound Care & Hygiene:   You may apply ice to the penis.  This may help to decrease swelling.  Remove the dressing tomorrow.  If the dressing falls off before then, leave it off.  You may shower or bathe in 48 hours  Gently wash the penis with soap and water.  The stitches do not need to be removed.  Activity:  Do  not drive or operate any equipment today.  The effects of anesthesia are still present, drowsiness may result.  Rest today, not necessarily flat bed rest, just take it easy.  You may resume your normal activity in one to two days or as indicated by your physician.  Sexual Activity   Erection and sexual relations should be avoided for 2 weeks.  Return to Work:  One to two days or as indicated by your physician.  Diet:  Drink liquids or eat a very light diet this evening.  You may resume a regular diet tomorrow.  General Expectations of your surgery:   You may have a small amount of bleeding  The penis will be swollen and bruised for approximately one week  You may wake during the night with an erection, usually this is caused by having a full bladder so you should try to urinate (pass your water) to relieve the erection or apply ice to the penis  Unexpected Observations - Call your doctor if these occur!  Persistent or heavy bleeding  Temperature of 101 degrees or more  Severe pain not relieved by medica      Post Anesthesia Home Care Instructions  Activity: Get plenty of rest for the remainder of the day. A responsible adult should stay with you for 24 hours following the  procedure.  For the next 24 hours, DO NOT: -Drive a car -Advertising copywriter -Drink alcoholic beverages -Take any medication unless instructed by your physician -Make any legal decisions or sign important papers.  Meals: Start with liquid foods such as gelatin or soup. Progress to regular foods as tolerated. Avoid greasy, spicy, heavy foods. If nausea and/or vomiting occur, drink only clear liquids until the nausea and/or vomiting subsides. Call your physician if vomiting continues.  Special Instructions/Symptoms: Your throat may feel dry or sore from the anesthesia or the breathing tube placed in your throat during surgery. If this causes discomfort, gargle with warm salt water. The discomfort should  disappear within 24 hours.  If you had a scopolamine patch placed behind your ear for the management of post- operative nausea and/or vomiting:  1. The medication in the patch is effective for 72 hours, after which it should be removed.  Wrap patch in a tissue and discard in the trash. Wash hands thoroughly with soap and water. 2. You may remove the patch earlier than 72 hours if you experience unpleasant side effects which may include dry mouth, dizziness or visual disturbances. 3. Avoid touching the patch. Wash your hands with soap and water after contact with the patch.

## 2020-07-29 ENCOUNTER — Encounter (HOSPITAL_BASED_OUTPATIENT_CLINIC_OR_DEPARTMENT_OTHER): Payer: Self-pay | Admitting: Urology

## 2020-08-05 NOTE — Progress Notes (Deleted)
Cardiology Office Note    Date:  08/05/2020   ID:  Cody Villegas, DOB 1960-07-28, MRN [id number]  PCP:  Shirlean Mylar, MD  Cardiologist: Tobias Alexander, MD EPS: None  No chief complaint on file.   History of Present Illness:  Cody Villegas is a 60 y.o. male with history of hypertension, DOE, obesity, OSA, dilated ascending aorta 4.1 cm, pulmonary nodules, GERD.  Abnormal GXT in 2017 for dyspnea on exertion followed by NST that showed a small moderate intensity fixed apical defect consistent with thinning no ischemia EF 57%.  Coronary CT 2019 calcium score 0 no evidence of CAD and dilated pulmonary artery measuring 37 mm suggestive of pulmonary hypertension.  2D echo 06/2019 EF 60 to 65% mildly reduced RV function mildly dilated pulmonary artery but normal PASP.  Last saw Dr. Delton See 05/07/2020 with some shortness of breath since he stopped exercising and stopped his blood pressure medications.  Follow-up 2D echo showed normal LV function.  He was started on valsartan 80 mg daily      Past Medical History:  Diagnosis Date  . Ascending aorta dilatation (HCC)    last chest CT in epic 04-15-2020,  4.3cm  (followed by cardiology)  . Balanitis   . Dilatation of pulmonic artery (HCC)    last CT chest in epic 04-15-2020  ,  4.0cm  (followed by cardiology)  . Dyspnea    cardiologist--- dr Eloy End;  positive ETT 08-15-2015 then had nuclear study 08-22-2015 no ischemia, nuclear ef 57%;  coronary CT 02-04-2018  calcium score 0, no evidence cad  . Essential hypertension    followed by pcp and cardiology  . GERD (gastroesophageal reflux disease)   . History of 2019 novel coronavirus disease (COVID-19) 02/2020   07-24-2020  per pt moderate symptoms all resolved with exception still no tast/ smell  . Hx of major depression   . Hypogonadism, male   . OSA (obstructive sleep apnea)    followed by dr a. Wynona Neat--- study in epic 10-07-2019 w/ moderate to severe osa , no cpap (pt choice)  .  Phimosis   . Pre-diabetes   . Umbilical hernia   . Wears partial dentures    lower    Past Surgical History:  Procedure Laterality Date  . APPENDECTOMY  age 27  . CIRCUMCISION N/A 07/26/2020   Procedure: CIRCUMCISION ADULT;  Surgeon: Rene Paci, MD;  Location: [facility];  Service: Urology;  Laterality: N/A;  ONLY NEEDS 45 MIN  . REVISION AMPUTATION OF FINGER Left 1986   ring finger  . TOTAL KNEE ARTHROPLASTY Right 2019  . VASECTOMY  2010  approx.    Current Medications: No outpatient medications have been marked as taking for the 08/07/20 encounter (Appointment) with Dyann Kief, PA-C.     Allergies:   Septra [sulfamethoxazole-trimethoprim]   Social History   Socioeconomic History  . Marital status: Married    Spouse name: Not on file  . Number of children: Not on file  . Years of education: Not on file  . Highest education level: Not on file  Occupational History  . Not on file  Tobacco Use  . Smoking status: Former Smoker    Years: 5.00    Types: Cigarettes    Quit date: 07/24/1984    Years since quitting: 36.0  . Smokeless tobacco: Former Neurosurgeon    Types: Chew    Quit date: 07/24/1984  Vaping Use  . Vaping Use: Never used  Substance and Sexual  Activity  . Alcohol use: Never    Alcohol/week: 0.0 standard drinks  . Drug use: Not Currently    Comment: last marijiuana age 75s  . Sexual activity: Not on file  Other Topics Concern  . Not on file  Social History Narrative  . Not on file   Social Determinants of Health   Financial Resource Strain: Not on file  Food Insecurity: Not on file  Transportation Needs: Not on file  Physical Activity: Not on file  Stress: Not on file  Social Connections: Not on file     Family History:  The patient's ***family history is not on file. He was adopted.   ROS:   Please see the history of present illness.    ROS All other systems reviewed and are negative.   PHYSICAL EXAM:   VS:   There were no vitals taken for this visit.  Physical Exam  GEN: Well nourished, well developed, in no acute distress  HEENT: normal  Neck: no JVD, carotid bruits, or masses Cardiac:RRR; no murmurs, rubs, or gallops  Respiratory:  clear to auscultation bilaterally, normal work of breathing GI: soft, nontender, nondistended, + BS Ext: without cyanosis, clubbing, or edema, Good distal pulses bilaterally MS: no deformity or atrophy  Skin: warm and dry, no rash Neuro:  Alert and Oriented x 3, Strength and sensation are intact Psych: euthymic mood, full affect  Wt Readings from Last 3 Encounters:  07/26/20 243 lb 14.4 oz (110.6 kg)  05/07/20 246 lb 12.8 oz (111.9 kg)  10/12/19 241 lb 6.4 oz (109.5 kg)      Studies/Labs Reviewed:   EKG:  EKG is*** ordered today.  The ekg ordered today demonstrates ***  Recent Labs: 05/07/2020: ALT 31; Platelets 254; TSH 3.100 07/26/2020: BUN 15; Creatinine, Ser 0.70; Hemoglobin 16.3; Potassium 4.0; Sodium 140   Lipid Panel    Component Value Date/Time   CHOL 184 05/07/2020 0935   TRIG 163 (H) 05/07/2020 0935   HDL 51 05/07/2020 0935   CHOLHDL 3.6 05/07/2020 0935   CHOLHDL 3.8 08/15/2015 0907   VLDL 33 (H) 08/15/2015 0907   LDLCALC 105 (H) 05/07/2020 0935    Additional studies/ records that were reviewed today include:  CT angio of chest 07/19 IMPRESSION: 1. Mild aneurysmal dilatation of the ascending aorta, measuring up to 4.1 cm in diameter. This is stable from prior study in 2019. Recommend annual imaging followup by CTA or MRA. This recommendation follows 2010 ACCF/AHA/AATS/ACR/ASA/SCA/SCAI/SIR/STS/SVM Guidelines for the Diagnosis and Management of Patients with Thoracic Aortic Disease. Circulation. 2010; 121: J628-B151. Aortic aneurysm NOS (ICD10-I71.9) 2. No acute pulmonary embolism identified.Dilated main pulmonary artery, nonspecific but can be seen with pulmonary arterial hypertension. 3. A 6 mm pulmonary nodule in the medial  right upper lobe, outside the field of view on the patient's prior study. Non-contrast chest CT at 6-12 months is recommended. If the nodule is stable at time of repeat CT, then future CT at 18-24 months (from today's scan) is considered optional for low-risk patients, but is recommended for high-risk patients. This recommendation follows the consensus statement: Guidelines for Management of Incidental Pulmonary Nodules Detected on CT Images: From the Fleischner Society 2017; Radiology 2017; 284:228-243.   Echo 06/2019 1. Left ventricular ejection fraction, by visual estimation, is 60 to  65%. The left ventricle has normal function. There is no left ventricular  hypertrophy.   2. The left ventricle has no regional wall motion abnormalities.   3. Global right ventricle has mildly reduced systolic  function.The right  ventricular size is mildly enlarged. Right vetricular wall thickness was  not assessed.   4. Left atrial size was normal.   5. Right atrial size was mildly dilated.   6. The mitral valve is normal in structure. Trivial mitral valve  regurgitation.   7. The tricuspid valve is normal in structure.   8. The tricuspid valve is normal in structure. Tricuspid valve  regurgitation is trivial.   9. The aortic valve is tricuspid. Aortic valve regurgitation is not  visualized. No evidence of aortic valve sclerosis or stenosis.  10. The pulmonic valve was normal in structure. Pulmonic valve  regurgitation is trivial.  11. Aortic dilatation noted.  12. There is moderate dilatation of the ascending aorta measuring 45 mm.  13. TAA unchanged size from 2017.  14. Mildly dilated pulmonary artery.  15. Normal pulmonary artery systolic pressure.  16. Best view of PA shows size of 3.5 cm.  17. The atrial septum is grossly normal.    Coronary CT 01/2018 1. Coronary calcium score of 0. This was 0 percentile for age and sex matched control.   2. Normal coronary origin with right  dominance.   3. No evidence of CAD.   4. Dilated pulmonary artery measuring 37 mm suggestive of pulmonary hypertension.    Risk Assessment/Calculations:   {Does this patient have ATRIAL FIBRILLATION?:[id number]}     ASSESSMENT:    No diagnosis found.   PLAN:  In order of problems listed above:  Dyspnea on exertion felt related to obesity and inactivity  Hypertension valsartan started last office visit after he stopped amlodipine and lisinopril  Moderate to severe OSA CPAP recommended but patient declined  Pulmonary nodule 6 mm right upper lobe  Dilated ascending aorta 41 mm on CT 07/2019  Shared Decision Making/Informed Consent   {Are you ordering a CV Procedure (e.g. stress test, cath, DCCV, TEE, etc)?   Press F2        :[id number]}    Medication Adjustments/Labs and Tests Ordered: Current medicines are reviewed at length with the patient today.  Concerns regarding medicines are outlined above.  Medication changes, Labs and Tests ordered today are listed in the Patient Instructions below. There are no Patient Instructions on file for this visit.   Elson Clan, PA-C  08/05/2020 10:57 AM    [facility] [address] [place], [place], [place]  [address] Phone: [phone]; Fax: [phone]

## 2020-08-07 ENCOUNTER — Ambulatory Visit: Payer: BC Managed Care – PPO | Admitting: Physician Assistant

## 2020-08-07 DIAGNOSIS — G4733 Obstructive sleep apnea (adult) (pediatric): Secondary | ICD-10-CM

## 2020-08-07 DIAGNOSIS — I77810 Thoracic aortic ectasia: Secondary | ICD-10-CM

## 2020-08-07 DIAGNOSIS — I10 Essential (primary) hypertension: Secondary | ICD-10-CM

## 2020-08-07 DIAGNOSIS — R0600 Dyspnea, unspecified: Secondary | ICD-10-CM

## 2020-08-07 DIAGNOSIS — R911 Solitary pulmonary nodule: Secondary | ICD-10-CM

## 2020-08-30 DIAGNOSIS — N481 Balanitis: Secondary | ICD-10-CM | POA: Diagnosis not present

## 2020-10-01 ENCOUNTER — Encounter: Payer: Self-pay | Admitting: Physician Assistant

## 2020-10-01 NOTE — Progress Notes (Signed)
Cardiology Office Note    Date:  10/04/2020   ID:  Cody Villegas, DOB 12/22/1960, MRN [id number]  PCP:  Shirlean Mylar, MD  Cardiologist:  Tobias Alexander, MD  Electrophysiologist:  None   Chief Complaint: routine follow-up, also noting chest tightness  History of Present Illness:   Cody Villegas is a 60 y.o. male with history of esophageal reflux, male hypogonadism, obesity, OSA, pre-DM, prior depression, dilation of ascending aorta, right ventricular dysfunction by echo, pulmonary nodule, HLD who returns for cardiac follow-up.  He established with Dr. Delton See in 2017 for CP/dyspnea. 2D echo 06/2015 showed EF 55-60%, grade 1 DD, mildly dilated RV. He also had a ETT which was abnormal with fluctuating blood pressure and abnormal EKG segments. Dr. Delton See ordered a f/u stress test which showde a small, moderate intensity, fixed apical defect consistent with thinning; no ischemia; EF 57% with normal wall motion, felt reassuring. He had a coronary CTA 01/2018 for DOE showing no evidence of CAD. This did show dilated pulmonary artery measuring 37 mm suggestive of pulmonary hypertension.   He was seen back for follow-up in 2021 and had blood pressure medications titrated. 2D echo 07/28/19 showed EF 60-65%, mildly reduced RV function and mildly enlarged RV, mild RAE, mildly dilated pulmonary artery but normal PASP, moderate dilation of ascending aorta felt similar to prior. CT scan 08/25/19 showed mild aneurysmal dilation of ascending aorta to 4.1cm, stable from prior study, annual follow-up recommended. There was a dilated pulmonary artery and 41mm pulmonary nodule in medial RUL with recommendation for noncontrasted CT in 6-12 months. He declined pulmonary evaluation but was following with neurology for moderately severe OSA seen on a home sleep study. He declined to use CPAP. Repeat CT of the chest in 04/2020 showed extensive irregular ground glass opacities consistent with subacute Covid-19 disease (had  recently had this), stable 17mm pulmonary nodule of the medial posterior right upper lobe, unchanged enlargement of the tubular ascending thoracic aorta measuring 4.3x4.2cm, enlargement of the main PA again, and hepatic steatosis. He was started on Crestor 5mg  daily.  He is seen back for routine follow-up overall doing well. He does notice daily episodes of chest tightness of varying duration. He describes these more as a congestion type feeling in the upper/middle of his chest. He feels like it might be allergies. It is not provoked by activity and comes/goes without provocation and resolves spontaneously. Also gets a sporadic left sided pain, again without provocation. It's been going on about a month but he also reports he had similar symptoms in prior years which prompted cardiac workup as above. He's started going to the gym and denies any exertional chest pain. He has chronic DOE which he states is unchanged. He does complain of generalized fatigue. No hypersomnolence or falling asleep during the day. No nausea, diaphoresis, palpitations or syncope reported.  Labwork independently reviewed: 06/2020 istat K 4.0, Cr 0.70 04/2020 LDL 105, TSH wnl, CBC wnl, CMET wnl except glucose 116  Past Medical History:  Diagnosis Date  . Ascending aorta dilatation (HCC)   . Balanitis   . Dilatation of pulmonic artery (HCC)   . Essential hypertension    followed by pcp and cardiology  . GERD (gastroesophageal reflux disease)   . History of 2019 novel coronavirus disease (COVID-19) 02/2020   07-24-2020  per pt moderate symptoms all resolved with exception still no tast/ smell  . Hx of major depression   . Hyperlipidemia   . Hypogonadism, male   . OSA (obstructive  sleep apnea)    followed by dr a. Wynona Neatolalere--- study in epic 10-07-2019 w/ moderate to severe osa , no cpap (pt choice)  . Phimosis   . Pre-diabetes   . Right ventricular dysfunction   . Umbilical hernia   . Wears partial dentures    lower     Past Surgical History:  Procedure Laterality Date  . APPENDECTOMY  age 60  . CIRCUMCISION N/A 07/26/2020   Procedure: CIRCUMCISION ADULT;  Surgeon: Rene PaciWinter, Christopher Aaron, MD;  [facility];  Service: Urology;  Laterality: N/A;  ONLY NEEDS 45 MIN  . REVISION AMPUTATION OF FINGER Left 1986   ring finger  . TOTAL KNEE ARTHROPLASTY Right 2019  . VASECTOMY  2010  approx.    Current Medications: Current Meds  Medication Sig  . albuterol (VENTOLIN HFA) 108 (90 Base) MCG/ACT inhaler as needed.  . fluticasone (FLONASE) 50 MCG/ACT nasal spray Place into both nostrils as needed for allergies or rhinitis.  Marland Kitchen. ibuprofen (ADVIL) 200 MG tablet Take 200 mg by mouth every 6 (six) hours as needed.  Marland Kitchen. omeprazole (PRILOSEC) 40 MG capsule Take 40 mg by mouth daily as needed.  . rosuvastatin (CRESTOR) 5 MG tablet Take 1 tablet (5 mg total) by mouth daily.  . valsartan (DIOVAN) 160 MG tablet Take 1 tablet (160 mg total) by mouth daily.      Allergies:   Septra [sulfamethoxazole-trimethoprim]   Social History   Socioeconomic History  . Marital status: Married    Spouse name: Not on file  . Number of children: Not on file  . Years of education: Not on file  . Highest education level: Not on file  Occupational History  . Not on file  Tobacco Use  . Smoking status: Former Smoker    Years: 5.00    Types: Cigarettes    Quit date: 07/24/1984    Years since quitting: 36.2  . Smokeless tobacco: Former NeurosurgeonUser    Types: Chew    Quit date: 07/24/1984  Vaping Use  . Vaping Use: Never used  Substance and Sexual Activity  . Alcohol use: Never    Alcohol/week: 0.0 standard drinks  . Drug use: Not Currently    Comment: last marijiuana age [age]s  . Sexual activity: Not on file  Other Topics Concern  . Not on file  Social History Narrative  . Not on file   Social Determinants of Health   Financial Resource Strain: Not on file  Food Insecurity: Not on file  Transportation  Needs: Not on file  Physical Activity: Not on file  Stress: Not on file  Social Connections: Not on file     Family History:  The patient's family history is not on file. He was adopted.  ROS:   Please see the history of present illness. All other systems are reviewed and otherwise negative.    EKGs/Labs/Other Studies Reviewed:    Studies reviewed are outlined and summarized above. Reports included below if pertinent.  2d echo 06/2019 1. Left ventricular ejection fraction, by visual estimation, is 60 to  65%. The left ventricle has normal function. There is no left ventricular  hypertrophy.  2. The left ventricle has no regional wall motion abnormalities.  3. Global right ventricle has mildly reduced systolic function.The right  ventricular size is mildly enlarged. Right vetricular wall thickness was  not assessed.  4. Left atrial size was normal.  5. Right atrial size was mildly dilated.  6. The mitral valve is normal  in structure. Trivial mitral valve  regurgitation.  7. The tricuspid valve is normal in structure.  8. The tricuspid valve is normal in structure. Tricuspid valve  regurgitation is trivial.  9. The aortic valve is tricuspid. Aortic valve regurgitation is not  visualized. No evidence of aortic valve sclerosis or stenosis.  10. The pulmonic valve was normal in structure. Pulmonic valve  regurgitation is trivial.  11. Aortic dilatation noted.  12. There is moderate dilatation of the ascending aorta measuring 45 mm.  13. TAA unchanged size from 2017.  14. Mildly dilated pulmonary artery.  15. Normal pulmonary artery systolic pressure.  16. Best view of PA shows size of 3.5 cm.  17. The atrial septum is grossly normal.   Cor CT 01/2018 IMPRESSION: 1. Coronary calcium score of 0. This was 0 percentile for age and sex matched control.  2. Normal coronary origin with right dominance.  3. No evidence of CAD.  4. Dilated pulmonary artery measuring  37 mm suggestive of pulmonary hypertension.  Electronically Signed: By: Tobias Alexander On: 02/04/2018 09:56  Otherwise studies outlined above and available in EMR    EKG:  EKG is ordered today, personally reviewed, demonstrating NSR 75bpm, TWI III, avF, no change from prior  Recent Labs: 05/07/2020: ALT 31; Platelets 254; TSH 3.100 07/26/2020: BUN 15; Creatinine, Ser 0.70; Hemoglobin 16.3; Potassium 4.0; Sodium 140  Recent Lipid Panel    Component Value Date/Time   CHOL 184 05/07/2020 0935   TRIG 163 (H) 05/07/2020 0935   HDL 51 05/07/2020 0935   CHOLHDL 3.6 05/07/2020 0935   CHOLHDL 3.8 08/15/2015 0907   VLDL 33 (H) 08/15/2015 0907   LDLCALC 105 (H) 05/07/2020 0935    PHYSICAL EXAM:    VS:  BP 118/80   Pulse 75   Ht 5' 6.5" (1.689 m)   Wt 246 lb (111.6 kg)   SpO2 97%   BMI 39.11 kg/m   BMI: Body mass index is 39.11 kg/m.  GEN: Well nourished, well developed male in no acute distress HEENT: normocephalic, atraumatic Neck: no JVD, carotid bruits, or masses Cardiac: RRR; no murmurs, rubs, or gallops, no edema  Respiratory:  clear to auscultation bilaterally, normal work of breathing GI: soft, nontender, nondistended, + BS MS: no deformity or atrophy Skin: warm and dry, no cellulitic changes Neuro:  Alert and Oriented x 3, Strength and sensation are intact, follows commands Psych: euthymic mood, full affect  Wt Readings from Last 3 Encounters:  10/04/20 246 lb (111.6 kg)  07/26/20 243 lb 14.4 oz (110.6 kg)  05/07/20 246 lb 12.8 oz (111.9 kg)     ASSESSMENT & PLAN:   1. Chest tightness - atypical features generally. The patient thinks this is related to allergies. His prior ischemic testing has been reassuring. It has been 3 years since his last assessment. 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 Typically patients must remain out of work while this is being completed. I  discussed this with him and he declines a work note today. He believes he can walk on a treadmill. If not, we discussed changing to a Lexiscan. EKG unchanged. Will also update his CBC, TSH today with labs to ensure no acute changes. I am also somewhat concerned about his history of RV dysfunction and whether pulmonary process is playing a role - see below.  Shared Decision Making/Informed Consent The risks [chest pain, shortness of  breath, cardiac arrhythmias, dizziness, blood pressure fluctuations, myocardial infarction, stroke/transient ischemic attack, nausea, vomiting, allergic reaction, radiation exposure, metallic taste sensation and life-threatening complications (estimated to be 1 in 10,000)], benefits (risk stratification, diagnosing coronary artery disease, treatment guidance) and alternatives of a nuclear stress test were discussed in detail with Mr. Blann and he agrees to proceed.  2. Right ventricular dysfunction - noted on prior echo along with dilated PA. 2D echo 06/2019 showed RV enlargement and reduced function. He is not showing any signs of acute right heart failure. Will update his echocardiogram to reassess (last done 06/2019). I would suspect this is due to untreated OSA but given his pulmonary nodule and abnormal CT last fall, I will refer to pulmonology for formal evaluation. He describes chronic DOE which is unchanged along with the atypical-type of chest tightness he feels is more "allergies."  3. Essential HTN - BP well controlled. No changes today. Continue to follow with lifestyle modifications.  4. OSA - moderately severe by prior report, and neuro note states moderate. CPAP recommended at that time and he declined. We discussed the cardiac and neurologic sequelae of untreated OSA and I have asked him to reconsider. 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 He reports generalized fatigue but no episodes of falling asleep  spontaneously. He is also working on exercise and weight loss.  5. Dilated aorta - will reassess by echocardiogram and decide on timing of next CT scan (will need to take into account pulmonology recs as well).  6. Hyperlipidemia - check CMET/lipid profile today. Only had a banana.  Disposition: F/u with me after above testing - OK to be virtual.  Medication Adjustments/Labs and Tests Ordered: Current medicines are reviewed at length with the patient today.  Concerns regarding medicines are outlined above. Medication changes, Labs and Tests ordered today are summarized above and listed in the Patient Instructions accessible in Encounters.   Signed, Laurann Montana, PA-C  10/04/2020 10:36 AM    [facility] [address] [place], [place], [place]  [address] Phone: [phone]; Fax: [phone]

## 2020-10-04 ENCOUNTER — Ambulatory Visit: Payer: BC Managed Care – PPO | Admitting: Physician Assistant

## 2020-10-04 ENCOUNTER — Other Ambulatory Visit: Payer: Self-pay

## 2020-10-04 ENCOUNTER — Encounter: Payer: Self-pay | Admitting: Physician Assistant

## 2020-10-04 VITALS — BP 118/80 | HR 75 | Ht 66.5 in | Wt 246.0 lb

## 2020-10-04 DIAGNOSIS — I519 Heart disease, unspecified: Secondary | ICD-10-CM | POA: Diagnosis not present

## 2020-10-04 DIAGNOSIS — R0789 Other chest pain: Secondary | ICD-10-CM

## 2020-10-04 DIAGNOSIS — G4733 Obstructive sleep apnea (adult) (pediatric): Secondary | ICD-10-CM | POA: Diagnosis not present

## 2020-10-04 DIAGNOSIS — I77810 Thoracic aortic ectasia: Secondary | ICD-10-CM

## 2020-10-04 DIAGNOSIS — I10 Essential (primary) hypertension: Secondary | ICD-10-CM

## 2020-10-04 DIAGNOSIS — E785 Hyperlipidemia, unspecified: Secondary | ICD-10-CM | POA: Diagnosis not present

## 2020-10-04 LAB — CBC
Hematocrit: 42.7 % (ref 37.5–51.0)
Hemoglobin: 14.2 g/dL (ref 13.0–17.7)
MCH: 30.5 pg (ref 26.6–33.0)
MCHC: 33.3 g/dL (ref 31.5–35.7)
MCV: 92 fL (ref 79–97)
Platelets: 293 10*3/uL (ref 150–450)
RBC: 4.66 x10E6/uL (ref 4.14–5.80)
RDW: 12.1 % (ref 11.6–15.4)
WBC: 7.8 10*3/uL (ref 3.4–10.8)

## 2020-10-04 LAB — COMPREHENSIVE METABOLIC PANEL
ALT: 25 IU/L (ref 0–44)
AST: 35 IU/L (ref 0–40)
Albumin/Globulin Ratio: 1.8 (ref 1.2–2.2)
Albumin: 4.6 g/dL (ref 3.8–4.9)
Alkaline Phosphatase: 103 IU/L (ref 44–121)
BUN/Creatinine Ratio: 17 (ref 9–20)
BUN: 15 mg/dL (ref 6–24)
Bilirubin Total: 0.5 mg/dL (ref 0.0–1.2)
CO2: 24 mmol/L (ref 20–29)
Calcium: 9.1 mg/dL (ref 8.7–10.2)
Chloride: 101 mmol/L (ref 96–106)
Creatinine, Ser: 0.88 mg/dL (ref 0.76–1.27)
Globulin, Total: 2.5 g/dL (ref 1.5–4.5)
Glucose: 97 mg/dL (ref 65–99)
Potassium: 4.2 mmol/L (ref 3.5–5.2)
Sodium: 138 mmol/L (ref 134–144)
Total Protein: 7.1 g/dL (ref 6.0–8.5)
eGFR: 100 mL/min/{1.73_m2} (ref >59)

## 2020-10-04 LAB — LIPID PANEL
Chol/HDL Ratio: 2.8 ratio (ref 0.0–5.0)
Cholesterol, Total: 119 mg/dL (ref 100–199)
HDL: 42 mg/dL (ref >39)
LDL Chol Calc (NIH): 55 mg/dL (ref 0–99)
Triglycerides: 122 mg/dL (ref 0–149)
VLDL Cholesterol Cal: 22 mg/dL (ref 5–40)

## 2020-10-04 LAB — TSH: TSH: 2.52 u[IU]/mL (ref 0.450–4.500)

## 2020-10-04 NOTE — Progress Notes (Signed)
Pt has been made aware of normal result and verbalized understanding.  jw

## 2020-10-04 NOTE — Patient Instructions (Addendum)
Medication Instructions:  Your physician recommends that you continue on your current medications as directed. Please refer to the Current Medication list given to you today.  *If you need a refill on your cardiac medications before your next appointment, please call your pharmacy*   Lab Work: TODAY:  CMET, LIPID, CBC, & TSH   If you have labs (blood work) drawn today and your tests are completely normal, you will receive your results only by: Marland Kitchen MyChart Message (if you have MyChart) OR . A paper copy in the mail If you have any lab test that is abnormal or we need to change your treatment, we will call you to review the results.   Testing/Procedures: Your physician has requested that you have an echocardiogram. Echocardiography is a painless test that uses sound waves to create images of your heart. It provides your doctor with information about the size and shape of your heart and how well your heart's chambers and valves are working. This procedure takes approximately one hour. There are no restrictions for this procedure.    Your physician has requested that you have en exercise stress myoview. For further information please visit [email or web address] Please follow instruction sheet, BELOW:   You are scheduled for a Myocardial Perfusion Imaging Study   Please arrive 15 minutes prior to your appointment time for registration and insurance purposes.  The test will take approximately 3 to 4 hours to complete; you may bring reading material.  If someone comes with you to your appointment, they will need to remain in the main lobby due to limited space in the testing area. **If you are pregnant or breastfeeding, please notify the nuclear lab prior to your appointment**  How to prepare for your Myocardial Perfusion Test: . Do not eat or drink 3 hours prior to your test, except you may have water. . Do not consume products containing caffeine (regular or decaffeinated) 12 hours prior to  your test. (ex: coffee, chocolate, sodas, tea). . Do bring a list of your current medications with you.  If not listed below, you may take your medications as normal. . Do wear comfortable clothes (no dresses or overalls) and walking shoes, tennis shoes preferred (No heels or open toe shoes are allowed). . Do NOT wear cologne, perfume, aftershave, or lotions (deodorant is allowed). . If these instructions are not followed, your test will have to be rescheduled.  You will also have to get covid tested prior to your stress test.  They will let you know when/where to go.   Follow-Up: At [facility], you and your health needs are our priority.  As part of our continuing mission to provide you with exceptional heart care, we have created designated Provider Care Teams.  These Care Teams include your primary Cardiologist (physician) and Advanced Practice Providers (APPs -  Physician Assistants and Nurse Practitioners) who all work together to provide you with the care you need, when you need it.  We recommend signing up for the patient portal called "MyChart".  Sign up information is provided on this After Visit Summary.  MyChart is used to connect with patients for Virtual Visits (Telemedicine).  Patients are able to view lab/test results, encounter notes, upcoming appointments, etc.  Non-urgent messages can be sent to your provider as well.   To learn more about what you can do with MyChart, go to [email or web address].    Your next appointment:   After testing    The format for your next appointment:  Either  Provider:   Ronie Spies, PA-C   Other Instructions  I strongly encourage you to reach out to your neurologist to discuss options for treatment of sleep apnea.  It was great to meet you today!

## 2020-10-31 DIAGNOSIS — I10 Essential (primary) hypertension: Secondary | ICD-10-CM | POA: Diagnosis not present

## 2020-10-31 DIAGNOSIS — E669 Obesity, unspecified: Secondary | ICD-10-CM | POA: Diagnosis not present

## 2020-10-31 DIAGNOSIS — G4733 Obstructive sleep apnea (adult) (pediatric): Secondary | ICD-10-CM | POA: Diagnosis not present

## 2020-10-31 DIAGNOSIS — E782 Mixed hyperlipidemia: Secondary | ICD-10-CM | POA: Diagnosis not present

## 2020-11-02 ENCOUNTER — Other Ambulatory Visit: Payer: Self-pay | Admitting: Cardiology

## 2020-11-02 DIAGNOSIS — I10 Essential (primary) hypertension: Secondary | ICD-10-CM

## 2020-11-02 DIAGNOSIS — E782 Mixed hyperlipidemia: Secondary | ICD-10-CM

## 2020-11-02 DIAGNOSIS — R7303 Prediabetes: Secondary | ICD-10-CM

## 2020-11-02 DIAGNOSIS — R911 Solitary pulmonary nodule: Secondary | ICD-10-CM

## 2020-11-12 ENCOUNTER — Ambulatory Visit: Payer: BC Managed Care – PPO | Admitting: Pulmonary Disease

## 2020-11-14 ENCOUNTER — Telehealth (HOSPITAL_COMMUNITY): Payer: Self-pay | Admitting: *Deleted

## 2020-11-14 NOTE — Telephone Encounter (Signed)
Left message on voicemail per DPR in reference to upcoming appointment scheduled on 11/20/20 with detailed instructions given per Myocardial Perfusion Study Information Sheet for the test. LM to arrive 15 minutes early, and that it is imperative to arrive on time for appointment to keep from having the test rescheduled. If you need to cancel or reschedule your appointment, please call the office within 24 hours of your appointment. Failure to do so may result in a cancellation of your appointment, and a $50 no show fee. Phone number given for call back for any questions. Kampbell Holaway Jacqueline    

## 2020-11-18 ENCOUNTER — Other Ambulatory Visit (HOSPITAL_COMMUNITY): Payer: BC Managed Care – PPO

## 2020-11-20 ENCOUNTER — Ambulatory Visit (HOSPITAL_COMMUNITY): Payer: BC Managed Care – PPO | Attending: Cardiology

## 2020-11-20 ENCOUNTER — Ambulatory Visit: Payer: BC Managed Care – PPO | Admitting: Pulmonary Disease

## 2020-11-20 ENCOUNTER — Ambulatory Visit (HOSPITAL_BASED_OUTPATIENT_CLINIC_OR_DEPARTMENT_OTHER): Payer: BC Managed Care – PPO

## 2020-11-20 ENCOUNTER — Other Ambulatory Visit: Payer: Self-pay | Admitting: Pulmonary Disease

## 2020-11-20 ENCOUNTER — Encounter: Payer: Self-pay | Admitting: Pulmonary Disease

## 2020-11-20 ENCOUNTER — Other Ambulatory Visit: Payer: Self-pay

## 2020-11-20 VITALS — BP 122/72 | HR 92 | Temp 98.3°F | Ht 66.5 in | Wt 247.4 lb

## 2020-11-20 DIAGNOSIS — I519 Heart disease, unspecified: Secondary | ICD-10-CM | POA: Diagnosis not present

## 2020-11-20 DIAGNOSIS — E785 Hyperlipidemia, unspecified: Secondary | ICD-10-CM | POA: Diagnosis not present

## 2020-11-20 DIAGNOSIS — R0789 Other chest pain: Secondary | ICD-10-CM | POA: Insufficient documentation

## 2020-11-20 DIAGNOSIS — R079 Chest pain, unspecified: Secondary | ICD-10-CM

## 2020-11-20 DIAGNOSIS — I77810 Thoracic aortic ectasia: Secondary | ICD-10-CM | POA: Diagnosis not present

## 2020-11-20 DIAGNOSIS — I10 Essential (primary) hypertension: Secondary | ICD-10-CM | POA: Insufficient documentation

## 2020-11-20 DIAGNOSIS — R0609 Other forms of dyspnea: Secondary | ICD-10-CM

## 2020-11-20 DIAGNOSIS — R0600 Dyspnea, unspecified: Secondary | ICD-10-CM | POA: Diagnosis not present

## 2020-11-20 DIAGNOSIS — G4733 Obstructive sleep apnea (adult) (pediatric): Secondary | ICD-10-CM | POA: Insufficient documentation

## 2020-11-20 LAB — MYOCARDIAL PERFUSION IMAGING
Estimated workload: 10.1 METS
Exercise duration (min): 8 min
Exercise duration (sec): 45 s
LV dias vol: 93 mL (ref 62–150)
LV sys vol: 38 mL
MPHR: 162 {beats}/min
Peak HR: 148 {beats}/min
Percent HR: 91 %
Rest HR: 67 {beats}/min
SDS: 0
SRS: 0
SSS: 0
TID: 0.88

## 2020-11-20 LAB — ECHOCARDIOGRAM COMPLETE
Area-P 1/2: 3.6 cm2
S' Lateral: 2.9 cm

## 2020-11-20 MED ORDER — TECHNETIUM TC 99M TETROFOSMIN IV KIT
10.1000 | PACK | Freq: Once | INTRAVENOUS | Status: AC | PRN
Start: 2020-11-20 — End: 2020-11-20
  Administered 2020-11-20: 10.1 via INTRAVENOUS
  Filled 2020-11-20: qty 11

## 2020-11-20 NOTE — Progress Notes (Signed)
Subjective:    Patient ID: Cody Villegas, male    DOB: [birth year], 60 y.o.   MRN: [id number]  Patient was seen a year ago for obstructive sleep apnea  Diagnosed with moderate obstructive sleep apnea Did not start CPAP  Was recently seen by a will physicians and will be picking up CPAP in the next couple of weeks  He had had COVID about November 2021 Recovered  Still has some shortness of breath  Still with issues of snoring, daytime sleepiness Goes to bed about 8 30-9 30 Falls asleep soon after Usually wakes up maybe once at night to use the bathroom  Final wake up time between 4 and 430  Continues to work on weight loss  States he is able to function well during the day without significant sleepiness  No family history known to him   Known to have a lung nodule for which CT follow-up was recommended  Past Medical History:  Diagnosis Date  . Ascending aorta dilatation (HCC)   . Balanitis   . Dilatation of pulmonic artery (HCC)   . Essential hypertension    followed by pcp and cardiology  . GERD (gastroesophageal reflux disease)   . History of 2019 novel coronavirus disease (COVID-19) 02/2020   07-24-2020  per pt moderate symptoms all resolved with exception still no tast/ smell  . Hx of major depression   . Hyperlipidemia   . Hypogonadism, male   . OSA (obstructive sleep apnea)    followed by dr a. Wynona Neat--- study in epic 10-07-2019 w/ moderate to severe osa , no cpap (pt choice)  . Phimosis   . Pre-diabetes   . Right ventricular dysfunction   . Umbilical hernia   . Wears partial dentures    lower    Social History   Socioeconomic History  . Marital status: Married    Spouse name: Not on file  . Number of children: Not on file  . Years of education: Not on file  . Highest education level: Not on file  Occupational History  . Not on file  Tobacco Use  . Smoking status: Former Smoker    Years: 5.00    Types: Cigarettes    Quit date: 07/24/1984     Years since quitting: 36.3  . Smokeless tobacco: Former Neurosurgeon    Types: Chew    Quit date: 07/24/1984  Vaping Use  . Vaping Use: Never used  Substance and Sexual Activity  . Alcohol use: Never    Alcohol/week: 0.0 standard drinks  . Drug use: Not Currently    Comment: last marijiuana age 20s  . Sexual activity: Not on file  Other Topics Concern  . Not on file  Social History Narrative  . Not on file   Social Determinants of Health   Financial Resource Strain: Not on file  Food Insecurity: Not on file  Transportation Needs: Not on file  Physical Activity: Not on file  Stress: Not on file  Social Connections: Not on file  Intimate Partner Violence: Not on file   Family history-he is adopted  Review of Systems  Constitutional: Negative for fever.  HENT: Negative for congestion, dental problem, ear pain, nosebleeds, postnasal drip, rhinorrhea, sinus pressure, sneezing, sore throat and trouble swallowing.   Respiratory: Negative for cough, chest tightness, shortness of breath and wheezing.   Cardiovascular: Negative for palpitations and leg swelling.  Gastrointestinal: Negative for nausea and vomiting.  Genitourinary: Negative for dysuria.  Allergic/Immunologic: Negative.  Negative for environmental allergies,  food allergies and immunocompromised state.  Psychiatric/Behavioral: Negative for dysphoric mood.       Objective:   Physical Exam Constitutional:      Appearance: He is obese.  HENT:     Nose: No congestion.     Mouth/Throat:     Comments: Crowded oropharynx, Mallampati 4 Eyes:     General: No scleral icterus.    Extraocular Movements: Extraocular movements intact.     Pupils: Pupils are equal, round, and reactive to light.  Cardiovascular:     Rate and Rhythm: Normal rate and regular rhythm.     Pulses: Normal pulses.     Heart sounds: No murmur heard.   Pulmonary:     Effort: Pulmonary effort is normal. No respiratory distress.     Breath sounds: No  stridor. No wheezing or rhonchi.  Musculoskeletal:     Cervical back: No rigidity or tenderness.  Neurological:     Mental Status: He is alert.  Psychiatric:        Mood and Affect: Mood normal.    Vitals:   11/20/20 1446  BP: 122/72  Pulse: 92  Temp: 98.3 F (36.8 C)  SpO2: 97%   Results of the Epworth flowsheet 08/23/2019  Sitting and reading 3  Watching TV 1  Sitting, inactive in a public place (e.g. a theatre or a meeting) 1  As a passenger in a car for an hour without a break 2  Lying down to rest in the afternoon when circumstances permit 1  Sitting and talking to someone 0  Sitting quietly after a lunch without alcohol 2  In a car, while stopped for a few minutes in traffic 0  Total score 10      Assessment & Plan:  .  History of moderate obstructive sleep apnea .  Excessive daytime sleepiness Was recently evaluated at [facility] physicians and will be getting a CPAP through them  .  Abnormal CT scan of the chest -Secondary to COVID infection in November 2021 -Symptoms are improved  .  Lung nodule  Plan: Repeat CT scan of the chest without contrast  Obtain pulmonary function tests  I will see him back in the office in about 4 to 6 weeks  Risk of untreated sleep disordered breathing discussed with the patient -He will be picking up the machine  Weight loss efforts encouraged

## 2020-11-20 NOTE — Patient Instructions (Signed)
CT scan of the chest without contrast  Pulmonary function test  Graded exercise as tolerated  I will see you in 4 to 6 weeks

## 2020-11-28 DIAGNOSIS — G4733 Obstructive sleep apnea (adult) (pediatric): Secondary | ICD-10-CM | POA: Diagnosis not present

## 2020-12-02 ENCOUNTER — Other Ambulatory Visit (HOSPITAL_COMMUNITY): Payer: BC Managed Care – PPO

## 2020-12-04 DIAGNOSIS — J349 Unspecified disorder of nose and nasal sinuses: Secondary | ICD-10-CM | POA: Diagnosis not present

## 2020-12-04 DIAGNOSIS — R509 Fever, unspecified: Secondary | ICD-10-CM | POA: Diagnosis not present

## 2020-12-04 DIAGNOSIS — Z20828 Contact with and (suspected) exposure to other viral communicable diseases: Secondary | ICD-10-CM | POA: Diagnosis not present

## 2020-12-04 DIAGNOSIS — R051 Acute cough: Secondary | ICD-10-CM | POA: Diagnosis not present

## 2020-12-18 ENCOUNTER — Ambulatory Visit: Payer: BC Managed Care – PPO | Admitting: Pulmonary Disease

## 2020-12-23 ENCOUNTER — Other Ambulatory Visit (HOSPITAL_COMMUNITY)
Admission: RE | Admit: 2020-12-23 | Discharge: 2020-12-23 | Disposition: A | Discharge status: Home / Self Care | Payer: BC Managed Care – PPO | Source: Ambulatory Visit | Attending: Pulmonary Disease | Admitting: Pulmonary Disease

## 2020-12-24 ENCOUNTER — Telehealth: Payer: Self-pay

## 2020-12-24 NOTE — Telephone Encounter (Signed)
LVM for pt in regards to CPAP order placed in 2021, would like to know if patient has been set up and if her is using machine?

## 2020-12-24 NOTE — Telephone Encounter (Signed)
Pt returned call and states that he received his CPAP from another doctors office. Pt also states he was supposed to get his covid test yesterday prior to his pft test and states they did not test him do to having Covid within the 30 day period.  Told him once I received the correct answer I would give him a call back.

## 2020-12-25 ENCOUNTER — Ambulatory Visit: Payer: BC Managed Care – PPO | Admitting: Acute Care

## 2020-12-28 DIAGNOSIS — G4733 Obstructive sleep apnea (adult) (pediatric): Secondary | ICD-10-CM | POA: Diagnosis not present

## 2021-01-22 DIAGNOSIS — G4733 Obstructive sleep apnea (adult) (pediatric): Secondary | ICD-10-CM | POA: Diagnosis not present

## 2021-01-28 ENCOUNTER — Encounter: Payer: Self-pay | Admitting: Physician Assistant

## 2021-01-28 DIAGNOSIS — G4733 Obstructive sleep apnea (adult) (pediatric): Secondary | ICD-10-CM | POA: Diagnosis not present

## 2021-01-28 NOTE — Progress Notes (Deleted)
Cardiology Office Note    Date:  01/28/2021   ID:  Cody Villegas, DOB [birth year], MRN [id number]  PCP:  Maurice Small, MD  Cardiologist:  Ena Dawley, MD (Inactive)  Electrophysiologist:  None   Chief Complaint: ***  History of Present Illness:   Cody Villegas is a 60 y.o. male with history of esophageal reflux, male hypogonadism, obesity, OSA, pre-DM, prior depression, dilation of ascending aorta, right ventricular dysfunction by echo 2021 (improved 2022), pulmonary nodule, HLD who returns for cardiac follow-up.   He established with Dr. Meda Coffee in 2017 for CP/dyspnea. 2D echo 06/2015 showed EF 55-60%, grade 1 DD, mildly dilated RV. He also had a ETT which was abnormal with fluctuating blood pressure and abnormal EKG segments. Dr. Meda Coffee ordered a f/u stress test which showed a small, moderate intensity, fixed apical defect consistent with thinning; no ischemia; EF 57% with normal wall motion, felt reassuring. He had a coronary CTA 01/2018 for DOE showing no evidence of CAD. This did show dilated pulmonary artery measuring 37 mm suggestive of pulmonary hypertension. He was seen back for follow-up in 2021 and had blood pressure medications titrated. 2D echo 07/28/19 showed EF 60-65%, mildly reduced RV function and mildly enlarged RV, mild RAE, mildly dilated pulmonary artery but normal PASP, moderate dilation of ascending aorta felt similar to prior. CT scan 08/25/19 showed mild aneurysmal dilation of ascending aorta to 4.1cm, stable from prior study, annual follow-up recommended. There was a dilated pulmonary artery and 690m pulmonary nodule in medial RUL with recommendation for noncontrasted CT in 6-12 months. He declined pulmonary evaluation but was following with neurology for moderately severe OSA seen on a home sleep study. He declined to use CPAP. Repeat CT of the chest in 04/2020 showed extensive irregular ground glass opacities consistent with subacute Covid-19 disease (had recently had this  prior to imaging), stable 631mpulmonary nodule of the medial posterior right upper lobe, unchanged enlargement of the tubular ascending thoracic aorta measuring 4.3x4.2cm, enlargement of the main PA again, and hepatic steatosis. He was started on Crestor 90m63maily.  I met him in follow-up 09/2020 at which time he was doing well. He did report episodic atypical-type chest pains and chronic DOE. We undertook further testing completed in 10/2020. 2D echo showed normal EF 60-65%, mild LVH, grade 1 DD, normal RV, mild LAE, moderately dilated aorta 34m48mST was low risk without evidence of ischemia, normal EF, +hypertensive response to exercise. 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  Atypical chest pain Essential HTN History of RV dysfunction, also hx of OSA Dilated aorta Hyperlipidemia   Labwork independently reviewed: 09/2020 TSH wnl, CBC wnl, CMET wnl, LDL 55   Past Medical History:  Diagnosis Date   Ascending aorta dilatation (HCC)    Balanitis    Dilatation of pulmonic artery (HCC)[place] Essential hypertension    followed by pcp and cardiology   GERD (gastroesophageal reflux disease)    History of 2019 novel coronavirus disease (COVID-19) 02/2020   07-24-2020  per pt moderate symptoms all resolved with exception still no tast/ smell   Hx of major depression    Hyperlipidemia    Hypogonadism, male    OSA (obstructive sleep apnea)    followed by dr a. olalAnder Sladestudy in epic 10-07-2019 w/ moderate to severe osa , no cpap (pt choice)   Phimosis    Pre-diabetes    Right ventricular dysfunction    Umbilical hernia  Wears partial dentures    lower    Past Surgical History:  Procedure Laterality Date   APPENDECTOMY  age 39   CIRCUMCISION N/A 07/26/2020   Procedure: CIRCUMCISION ADULT;  Surgeon: Ceasar Mons, MD;  [facility];  Service: Urology;  Laterality: N/A;  ONLY NEEDS 45 MIN    REVISION AMPUTATION OF FINGER Left 1986   ring finger   TOTAL KNEE ARTHROPLASTY Right 2019   VASECTOMY  2010  approx.    Current Medications: No outpatient medications have been marked as taking for the 01/29/21 encounter (Appointment) with Charlie Pitter, PA-C.   ***   Allergies:   Septra [sulfamethoxazole-trimethoprim]   Social History   Socioeconomic History   Marital status: Married    Spouse name: Not on file   Number of children: Not on file   Years of education: Not on file   Highest education level: Not on file  Occupational History   Not on file  Tobacco Use   Smoking status: Former    Years: 5.00    Types: Cigarettes    Quit date: 07/24/1984    Years since quitting: 36.5   Smokeless tobacco: Former    Types: Chew    Quit date: 07/24/1984  Vaping Use   Vaping Use: Never used  Substance and Sexual Activity   Alcohol use: Never    Alcohol/week: 0.0 standard drinks   Drug use: Not Currently    Comment: last marijiuana age 40s   Sexual activity: Not on file  Other Topics Concern   Not on file  Social History Narrative   Not on file   Social Determinants of Health   Financial Resource Strain: Not on file  Food Insecurity: Not on file  Transportation Needs: Not on file  Physical Activity: Not on file  Stress: Not on file  Social Connections: Not on file     Family History:  The patient's ***family history is not on file. He was adopted.  ROS:   Please see the history of present illness. Otherwise, review of systems is positive for ***.  All other systems are reviewed and otherwise negative.    EKGs/Labs/Other Studies Reviewed:    Studies reviewed are outlined and summarized above. Reports included below if pertinent.  2d echo 10/2020   1. Left ventricular ejection fraction, by estimation, is 60 to 65%. The  left ventricle has normal function. The left ventricle has no regional  wall motion abnormalities. There is mild left ventricular hypertrophy.   Left ventricular diastolic parameters  are consistent with Grade I diastolic dysfunction (impaired relaxation).   2. Right ventricular systolic function is normal. The right ventricular  size is normal.   3. Left atrial size was mildly dilated.   4. The mitral valve is normal in structure. No evidence of mitral valve  regurgitation. No evidence of mitral stenosis.   5. The aortic valve is normal in structure. Aortic valve regurgitation is  not visualized. No aortic stenosis is present.   6. Aortic dilatation noted. There is moderate dilatation of the ascending  aorta, measuring 44 mm.   7. The inferior vena cava is normal in size with greater than 50%  respiratory variability, suggesting right atrial pressure of 3 mmHg.   Comparison(s): No significant change from prior study. Prior images  reviewed side by side. Prior aorta 45 mm.   NST 10/2020 Nuclear stress EF: 59%. The left ventricular ejection fraction is normal (55-65%). Blood pressure demonstrated a hypertensive  response to exercise. There was no ST segment deviation noted during stress. The study is normal. This is a low risk study.    2d echo 06/2019  1. Left ventricular ejection fraction, by visual estimation, is 60 to  65%. The left ventricle has normal function. There is no left ventricular  hypertrophy.   2. The left ventricle has no regional wall motion abnormalities.   3. Global right ventricle has mildly reduced systolic function.The right  ventricular size is mildly enlarged. Right vetricular wall thickness was  not assessed.   4. Left atrial size was normal.   5. Right atrial size was mildly dilated.   6. The mitral valve is normal in structure. Trivial mitral valve  regurgitation.   7. The tricuspid valve is normal in structure.   8. The tricuspid valve is normal in structure. Tricuspid valve  regurgitation is trivial.   9. The aortic valve is tricuspid. Aortic valve regurgitation is not  visualized. No  evidence of aortic valve sclerosis or stenosis.  10. The pulmonic valve was normal in structure. Pulmonic valve  regurgitation is trivial.  11. Aortic dilatation noted.  12. There is moderate dilatation of the ascending aorta measuring 45 mm.  13. TAA unchanged size from 2017.  14. Mildly dilated pulmonary artery.  15. Normal pulmonary artery systolic pressure.  16. Best view of PA shows size of 3.5 cm.  17. The atrial septum is grossly normal.   Cor CT 01/2018 IMPRESSION: 1. Coronary calcium score of 0. This was 0 percentile for age and sex matched control.   2. Normal coronary origin with right dominance.   3. No evidence of CAD.   4. Dilated pulmonary artery measuring 37 mm suggestive of pulmonary hypertension.   Electronically Signed: By: Ena Dawley On: 02/04/2018 09:56   Otherwise studies outlined above and available in EMR    EKG:  EKG is ordered today, personally reviewed, demonstrating ***  Recent Labs: 10/04/2020: ALT 25; BUN 15; Creatinine, Ser 0.88; Hemoglobin 14.2; Platelets 293; Potassium 4.2; Sodium 138; TSH 2.520  Recent Lipid Panel    Component Value Date/Time   CHOL 119 10/04/2020 1055   TRIG 122 10/04/2020 1055   HDL 42 10/04/2020 1055   CHOLHDL 2.8 10/04/2020 1055   CHOLHDL 3.8 08/15/2015 0907   VLDL 33 (H) 08/15/2015 0907   LDLCALC 55 10/04/2020 1055    PHYSICAL EXAM:    VS:  There were no vitals taken for this visit.  BMI: There is no height or weight on file to calculate BMI.  GEN: Well nourished, well developed male in no acute distress HEENT: normocephalic, atraumatic Neck: no JVD, carotid bruits, or masses Cardiac: ***RRR; no murmurs, rubs, or gallops, no edema  Respiratory:  clear to auscultation bilaterally, normal work of breathing GI: soft, nontender, nondistended, + BS MS: no deformity or atrophy Skin: warm and dry, no rash Neuro:  Alert and Oriented x 3, Strength and sensation are intact, follows commands Psych: euthymic  mood, full affect  Wt Readings from Last 3 Encounters:  11/20/20 247 lb 6.4 oz (112.2 kg)  11/20/20 246 lb (111.6 kg)  10/04/20 246 lb (111.6 kg)     ASSESSMENT & PLAN:   ***     Disposition: F/u with ***   Medication Adjustments/Labs and Tests Ordered: Current medicines are reviewed at length with the patient today.  Concerns regarding medicines are outlined above. Medication changes, Labs and Tests ordered today are summarized above and listed in the Patient Instructions  accessible in Encounters.   Signed, Charlie Pitter, PA-C  01/28/2021 8:31 AM    [address], [place], [place]  [address] Phone: [phone]; Fax: [phone]

## 2021-01-29 ENCOUNTER — Ambulatory Visit: Payer: BC Managed Care – PPO | Admitting: Physician Assistant

## 2021-01-29 DIAGNOSIS — I77819 Aortic ectasia, unspecified site: Secondary | ICD-10-CM

## 2021-01-29 DIAGNOSIS — I10 Essential (primary) hypertension: Secondary | ICD-10-CM

## 2021-01-29 DIAGNOSIS — I519 Heart disease, unspecified: Secondary | ICD-10-CM

## 2021-01-29 DIAGNOSIS — E785 Hyperlipidemia, unspecified: Secondary | ICD-10-CM

## 2021-01-29 DIAGNOSIS — R0789 Other chest pain: Secondary | ICD-10-CM

## 2021-02-24 DIAGNOSIS — S161XXA Strain of muscle, fascia and tendon at neck level, initial encounter: Secondary | ICD-10-CM | POA: Diagnosis not present

## 2021-02-28 DIAGNOSIS — G4733 Obstructive sleep apnea (adult) (pediatric): Secondary | ICD-10-CM | POA: Diagnosis not present

## 2021-03-10 DIAGNOSIS — F4323 Adjustment disorder with mixed anxiety and depressed mood: Secondary | ICD-10-CM | POA: Diagnosis not present

## 2021-03-25 DIAGNOSIS — F4323 Adjustment disorder with mixed anxiety and depressed mood: Secondary | ICD-10-CM | POA: Diagnosis not present

## 2021-03-30 DIAGNOSIS — G4733 Obstructive sleep apnea (adult) (pediatric): Secondary | ICD-10-CM | POA: Diagnosis not present

## 2021-04-08 DIAGNOSIS — F4323 Adjustment disorder with mixed anxiety and depressed mood: Secondary | ICD-10-CM | POA: Diagnosis not present

## 2021-04-30 DIAGNOSIS — G4733 Obstructive sleep apnea (adult) (pediatric): Secondary | ICD-10-CM | POA: Diagnosis not present

## 2021-05-07 DIAGNOSIS — E782 Mixed hyperlipidemia: Secondary | ICD-10-CM | POA: Diagnosis not present

## 2021-05-07 DIAGNOSIS — Z0000 Encounter for general adult medical examination without abnormal findings: Secondary | ICD-10-CM | POA: Diagnosis not present

## 2021-05-07 DIAGNOSIS — I10 Essential (primary) hypertension: Secondary | ICD-10-CM | POA: Diagnosis not present

## 2021-05-07 DIAGNOSIS — R7303 Prediabetes: Secondary | ICD-10-CM | POA: Diagnosis not present

## 2021-05-30 DIAGNOSIS — G4733 Obstructive sleep apnea (adult) (pediatric): Secondary | ICD-10-CM | POA: Diagnosis not present

## 2021-06-18 DIAGNOSIS — M545 Low back pain, unspecified: Secondary | ICD-10-CM | POA: Diagnosis not present

## 2021-06-27 ENCOUNTER — Telehealth: Payer: Self-pay | Admitting: *Deleted

## 2021-06-27 DIAGNOSIS — M9905 Segmental and somatic dysfunction of pelvic region: Secondary | ICD-10-CM | POA: Diagnosis not present

## 2021-06-27 DIAGNOSIS — M5431 Sciatica, right side: Secondary | ICD-10-CM | POA: Diagnosis not present

## 2021-06-27 DIAGNOSIS — R911 Solitary pulmonary nodule: Secondary | ICD-10-CM

## 2021-06-27 DIAGNOSIS — M9903 Segmental and somatic dysfunction of lumbar region: Secondary | ICD-10-CM | POA: Diagnosis not present

## 2021-06-27 DIAGNOSIS — M5416 Radiculopathy, lumbar region: Secondary | ICD-10-CM | POA: Diagnosis not present

## 2021-06-27 NOTE — Telephone Encounter (Signed)
Order for repeat Chest CT W/O Contrast placed in the system for the pt to have done in May 2023.  This was okayed to order by pts new Cardiologist he will establish with on 07/30/21, Dr. Clifton James. Order placed and staff message sent to our CT dept to call the pt back and schedule this appt.

## 2021-06-27 NOTE — Telephone Encounter (Signed)
-----   Message from [name], MD sent at 06/27/2021  3:00 PM EST ----- Regarding: RE: repeat Chest CT WO Contrast to be done in 18-24 months per Dr. Delton See OK with me. Cody Villegas  ----- Message ----- From: [name], LPN Sent: 50/02/3817   2:41 PM EST To: [name], MD, # Subject: FW: repeat Chest CT WO Contrast to be done i#  Rockwell [place] thanks for sending this.  So it looks like he will establish care with Dr. Clifton James on 07/30/21 as his new Gen Cards.  I will need to have Dr. Clifton James advise if he is ok with [name] placing this order in his name, before meeting the pt on 07/30/21 and/or if this should go to his PCP to advise on.  Just don't want to place this order under his name to follow unless he's ok and before meeting him.    Dr. Clifton James, ok to proceed and order, wait until he see's you on 2/1 to advise on, or refer to PCP?  Dr. Delton See ordered on him back in 2021 and noted was a pulmonary nodule with recommendation to repeat chest ct w/o contrast to be done in 24 months.  24 month mark will be in May 2023.  Please advise.   Thanks all, [name]   ----- Message ----- From: Gaspar Bidding Sent: 06/27/2021   1:33 PM EST To: [name], LPN Subject: RE: repeat Chest CT WO Contrast to be done i#  Hi ladies  This popped up as a follow up that needs to be done in May of 2023.  [name]- can you place the order under his new Cardiologist and we will call him and get him scheduled?  Thanks [name] ----- Message ----- From: [name], LPN Sent: 29/93/7169   8:49 AM EST To: [name], # Subject: repeat Chest CT WO Contrast to be done in 18#  Per Dr. Delton See, this pt needs to be scheduled to have a repeat Chest CT WO Contrast to be done, to follow-up on stable, 6 mm pulmonary nodule of the medial posterior right upper  Lobe. Order is in the system and pt would like to go ahead and proceed with getting this scheduled and on the  books, and understands that tentatively, this could be rescheduled to another date, given how far out it is.   Can you please call the pt and schedule this and shoot me the date for follow-up placement?  He is aware you will call and arrange.  Thanks ladies for all you do, [name]

## 2021-06-30 DIAGNOSIS — G4733 Obstructive sleep apnea (adult) (pediatric): Secondary | ICD-10-CM | POA: Diagnosis not present

## 2021-07-01 DIAGNOSIS — M9903 Segmental and somatic dysfunction of lumbar region: Secondary | ICD-10-CM | POA: Diagnosis not present

## 2021-07-01 DIAGNOSIS — M5431 Sciatica, right side: Secondary | ICD-10-CM | POA: Diagnosis not present

## 2021-07-01 DIAGNOSIS — M9905 Segmental and somatic dysfunction of pelvic region: Secondary | ICD-10-CM | POA: Diagnosis not present

## 2021-07-01 DIAGNOSIS — M5416 Radiculopathy, lumbar region: Secondary | ICD-10-CM | POA: Diagnosis not present

## 2021-07-09 ENCOUNTER — Ambulatory Visit: Payer: BC Managed Care – PPO

## 2021-07-22 DIAGNOSIS — M5416 Radiculopathy, lumbar region: Secondary | ICD-10-CM | POA: Diagnosis not present

## 2021-07-22 DIAGNOSIS — M5431 Sciatica, right side: Secondary | ICD-10-CM | POA: Diagnosis not present

## 2021-07-22 DIAGNOSIS — M9905 Segmental and somatic dysfunction of pelvic region: Secondary | ICD-10-CM | POA: Diagnosis not present

## 2021-07-22 DIAGNOSIS — M9903 Segmental and somatic dysfunction of lumbar region: Secondary | ICD-10-CM | POA: Diagnosis not present

## 2021-07-24 DIAGNOSIS — G4733 Obstructive sleep apnea (adult) (pediatric): Secondary | ICD-10-CM | POA: Diagnosis not present

## 2021-07-30 ENCOUNTER — Other Ambulatory Visit: Payer: Self-pay

## 2021-07-30 ENCOUNTER — Ambulatory Visit (INDEPENDENT_AMBULATORY_CARE_PROVIDER_SITE_OTHER): Payer: BC Managed Care – PPO | Admitting: Cardiovascular Disease

## 2021-07-30 ENCOUNTER — Encounter: Payer: Self-pay | Admitting: Cardiovascular Disease

## 2021-07-30 VITALS — BP 132/82 | HR 82 | Ht 66.5 in | Wt 245.0 lb

## 2021-07-30 DIAGNOSIS — I10 Essential (primary) hypertension: Secondary | ICD-10-CM | POA: Diagnosis not present

## 2021-07-30 DIAGNOSIS — I77810 Thoracic aortic ectasia: Secondary | ICD-10-CM | POA: Diagnosis not present

## 2021-07-30 NOTE — Patient Instructions (Addendum)
Medication Instructions:  Your physician recommends that you continue on your current medications as directed. Please refer to the Current Medication list given to you today.  *If you need a refill on your cardiac medications before your next appointment, please call your pharmacy*   Lab Work: Your physician recommends that you return for lab work on Wednesday April 26. Lab is open from 7:15 am to 4:45 pm   If you have labs (blood work) drawn today and your tests are completely normal, you will receive your results only by: [place] (if you have MyChart) OR A paper copy in the mail If you have any lab test that is abnormal or we need to change your treatment, we will call you to review the results.   Testing/Procedures: None ordered    Follow-Up: At [facility], you and your health needs are our priority.  As part of our continuing mission to provide you with exceptional heart care, we have created designated Provider Care Teams.  These Care Teams include your primary Cardiologist (physician) and Advanced Practice Providers (APPs -  Physician Assistants and Nurse Practitioners) who all work together to provide you with the care you need, when you need it.  We recommend signing up for the patient portal called "MyChart".  Sign up information is provided on this After Visit Summary.  MyChart is used to connect with patients for Virtual Visits (Telemedicine).  Patients are able to view lab/test results, encounter notes, upcoming appointments, etc.  Non-urgent messages can be sent to your provider as well.   To learn more about what you can do with MyChart, go to [email or web address].    Your next appointment:   12 month(s)  The format for your next appointment:   In Person  Provider:   Lauree Chandler, MD  Other Instructions None

## 2021-07-30 NOTE — Progress Notes (Signed)
Chief Complaint  Patient presents with   Follow-up    Thoracic aortic aneurysm   History of Present Illness:60 yo male with history of GERD, obesity, sleep apnea, dilated ascending aorta and hyperlipidemia who is here today for follow follow up. He has been followed by Dr. Delton See and I am meeting him for the first time today. He was seen in 2017 for chest pain and dyspnea. Echo in 2017 with LVEF=55-60% with mildly dilated RV. Coronary CTA in 2019 with no evidence of CAD. His pulmonary artery was dilated suggestive of pulmonary HTN. Echo in 2021 with LVEF=60-65%, mildly reduced RV function with mildly enlarged RV. Dilated ascending aorta. CTA in 2021 with 4.3 cm ascending aorta. Echo May 2022 with LVEF=60-65%, no valve disease, dilated aortic root. Nuclear stress test in May 2022 with no ischemia.   He is here today for follow up. The patient denies any chest pain, dyspnea, palpitations, lower extremity edema, orthopnea, PND, dizziness, near syncope or syncope.   Primary Care Physician: Shirlean Mylar, MD   Past Medical History:  Diagnosis Date   Ascending aorta dilatation (HCC)    Balanitis    Dilatation of pulmonic artery [facility])    Essential hypertension    followed by pcp and cardiology   GERD (gastroesophageal reflux disease)    History of 2019 novel coronavirus disease (COVID-19) 02/2020   07-24-2020  per pt moderate symptoms all resolved with exception still no tast/ smell   Hx of major depression    Hyperlipidemia    Hypogonadism, male    OSA (obstructive sleep apnea)    followed by dr a. Wynona Neat--- study in epic 10-07-2019 w/ moderate to severe osa , no cpap (pt choice)   Phimosis    Pre-diabetes    Right ventricular dysfunction    resolved on 10/2020 echo   Umbilical hernia    Wears partial dentures    lower    Past Surgical History:  Procedure Laterality Date   APPENDECTOMY  age 87   CIRCUMCISION N/A 07/26/2020   Procedure: CIRCUMCISION ADULT;  Surgeon: Rene Paci, MD;  Location: [facility];  Service: Urology;  Laterality: N/A;  ONLY NEEDS 45 MIN   REVISION AMPUTATION OF FINGER Left 1986   ring finger   TOTAL KNEE ARTHROPLASTY Right 2019   VASECTOMY  2010  approx.    Current Outpatient Medications  Medication Sig Dispense Refill   albuterol (VENTOLIN HFA) 108 (90 Base) MCG/ACT inhaler as needed.     ibuprofen (ADVIL) 200 MG tablet Take 200 mg by mouth every 6 (six) hours as needed.     omeprazole (PRILOSEC) 40 MG capsule Take 40 mg by mouth daily as needed.     rosuvastatin (CRESTOR) 5 MG tablet Take 1 tablet (5 mg total) by mouth daily. 90 tablet 3   valsartan (DIOVAN) 160 MG tablet TAKE 1 TABLET BY MOUTH EVERY DAY 90 tablet 3   No current facility-administered medications for this visit.    Allergies  Allergen Reactions   Septra [Sulfamethoxazole-Trimethoprim] Rash    Social History   Socioeconomic History   Marital status: Married    Spouse name: Not on file   Number of children: Not on file   Years of education: Not on file   Highest education level: Not on file  Occupational History   Not on file  Tobacco Use   Smoking status: Former    Years: 5.00    Types: Cigarettes    Quit date: 07/24/1984  Years since quitting: 37.0   Smokeless tobacco: Former    Types: Chew    Quit date: 07/24/1984  Vaping Use   Vaping Use: Never used  Substance and Sexual Activity   Alcohol use: Never    Alcohol/week: 0.0 standard drinks   Drug use: Not Currently    Comment: last marijiuana age 89s   Sexual activity: Not on file  Other Topics Concern   Not on file  Social History Narrative   Not on file   Social Determinants of Health   Financial Resource Strain: Not on file  Food Insecurity: Not on file  Transportation Needs: Not on file  Physical Activity: Not on file  Stress: Not on file  Social Connections: Not on file  Intimate Partner Violence: Not on file    Family History  Adopted: Yes     Review of Systems:  As stated in the HPI and otherwise negative.   BP 132/82    Pulse 82    Ht 5' 6.5" (1.689 m)    Wt 245 lb (111.1 kg)    SpO2 95%    BMI 38.95 kg/m   Physical Examination: General: Well developed, well nourished, NAD  HEENT: OP clear, mucus membranes moist  SKIN: warm, dry. No rashes. Neuro: No focal deficits  Musculoskeletal: Muscle strength 5/5 all ext  Psychiatric: Mood and affect normal  Neck: No JVD, no carotid bruits, no thyromegaly, no lymphadenopathy.  Lungs:Clear bilaterally, no wheezes, rhonci, crackles Cardiovascular: Regular rate and rhythm. No murmurs, gallops or rubs. Abdomen:Soft. Bowel sounds present. Non-tender.  Extremities: No lower extremity edema. Pulses are 2 + in the bilateral DP/PT.  EKG:  EKG is not ordered today. The ekg ordered today demonstrates   Echo May 2022:  1. Left ventricular ejection fraction, by estimation, is 60 to 65%. The  left ventricle has normal function. The left ventricle has no regional  wall motion abnormalities. There is mild left ventricular hypertrophy.  Left ventricular diastolic parameters  are consistent with Grade I diastolic dysfunction (impaired relaxation).   2. Right ventricular systolic function is normal. The right ventricular  size is normal.   3. Left atrial size was mildly dilated.   4. The mitral valve is normal in structure. No evidence of mitral valve  regurgitation. No evidence of mitral stenosis.   5. The aortic valve is normal in structure. Aortic valve regurgitation is  not visualized. No aortic stenosis is present.   6. Aortic dilatation noted. There is moderate dilatation of the ascending  aorta, measuring 44 mm.   7. The inferior vena cava is normal in size with greater than 50%  respiratory variability, suggesting right atrial pressure of 3 mmHg.   Recent Labs: 10/04/2020: ALT 25; BUN 15; Creatinine, Ser 0.88; Hemoglobin 14.2; Platelets 293; Potassium 4.2; Sodium 138; TSH 2.520    Lipid Panel    Component Value Date/Time   CHOL 119 10/04/2020 1055   TRIG 122 10/04/2020 1055   HDL 42 10/04/2020 1055   CHOLHDL 2.8 10/04/2020 1055   CHOLHDL 3.8 08/15/2015 0907   VLDL 33 (H) 08/15/2015 0907   LDLCALC 55 10/04/2020 1055     Wt Readings from Last 3 Encounters:  07/30/21 245 lb (111.1 kg)  11/20/20 247 lb 6.4 oz (112.2 kg)  11/20/20 246 lb (111.6 kg)     Assessment and Plan:   1. Chest pain: Chronic. Not felt to be cardiac related. No evidence of CAD on cardiac CTA in 2019. Negative stress test in  2021.   2. Thoracic aortic aneurysm: 4.3 cm in 2022. Pending repeat chest CTA May 2023.   3. HTN: BP controlled. No changes.   4. Sleep apnea: He is on CPAP  Current medicines are reviewed at length with the patient today.  The patient does not have concerns regarding medicines.  The following changes have been made:  no change  Labs/ tests ordered today include:   Orders Placed This Encounter  Procedures   Basic metabolic panel   Disposition:   F/U with me one year.   Signed, Verne Carrow, MD 07/30/2021 10:38 AM    [facility] [address] [place], [place], [place]  [address] Phone: [phone]; Fax: [phone]

## 2021-08-05 DIAGNOSIS — M9905 Segmental and somatic dysfunction of pelvic region: Secondary | ICD-10-CM | POA: Diagnosis not present

## 2021-08-05 DIAGNOSIS — M5431 Sciatica, right side: Secondary | ICD-10-CM | POA: Diagnosis not present

## 2021-08-05 DIAGNOSIS — M9903 Segmental and somatic dysfunction of lumbar region: Secondary | ICD-10-CM | POA: Diagnosis not present

## 2021-08-05 DIAGNOSIS — M5416 Radiculopathy, lumbar region: Secondary | ICD-10-CM | POA: Diagnosis not present

## 2021-08-25 DIAGNOSIS — M9905 Segmental and somatic dysfunction of pelvic region: Secondary | ICD-10-CM | POA: Diagnosis not present

## 2021-08-25 DIAGNOSIS — M5431 Sciatica, right side: Secondary | ICD-10-CM | POA: Diagnosis not present

## 2021-08-25 DIAGNOSIS — M5416 Radiculopathy, lumbar region: Secondary | ICD-10-CM | POA: Diagnosis not present

## 2021-08-25 DIAGNOSIS — M9903 Segmental and somatic dysfunction of lumbar region: Secondary | ICD-10-CM | POA: Diagnosis not present

## 2021-08-29 DIAGNOSIS — M5431 Sciatica, right side: Secondary | ICD-10-CM | POA: Diagnosis not present

## 2021-08-29 DIAGNOSIS — M9905 Segmental and somatic dysfunction of pelvic region: Secondary | ICD-10-CM | POA: Diagnosis not present

## 2021-08-29 DIAGNOSIS — M9903 Segmental and somatic dysfunction of lumbar region: Secondary | ICD-10-CM | POA: Diagnosis not present

## 2021-08-29 DIAGNOSIS — M5416 Radiculopathy, lumbar region: Secondary | ICD-10-CM | POA: Diagnosis not present

## 2021-09-01 DIAGNOSIS — M9903 Segmental and somatic dysfunction of lumbar region: Secondary | ICD-10-CM | POA: Diagnosis not present

## 2021-09-01 DIAGNOSIS — M5431 Sciatica, right side: Secondary | ICD-10-CM | POA: Diagnosis not present

## 2021-09-01 DIAGNOSIS — M5416 Radiculopathy, lumbar region: Secondary | ICD-10-CM | POA: Diagnosis not present

## 2021-09-01 DIAGNOSIS — M9905 Segmental and somatic dysfunction of pelvic region: Secondary | ICD-10-CM | POA: Diagnosis not present

## 2021-09-03 DIAGNOSIS — L821 Other seborrheic keratosis: Secondary | ICD-10-CM | POA: Diagnosis not present

## 2021-09-03 DIAGNOSIS — L918 Other hypertrophic disorders of the skin: Secondary | ICD-10-CM | POA: Diagnosis not present

## 2021-09-15 DIAGNOSIS — M9905 Segmental and somatic dysfunction of pelvic region: Secondary | ICD-10-CM | POA: Diagnosis not present

## 2021-09-15 DIAGNOSIS — M5431 Sciatica, right side: Secondary | ICD-10-CM | POA: Diagnosis not present

## 2021-09-15 DIAGNOSIS — M5416 Radiculopathy, lumbar region: Secondary | ICD-10-CM | POA: Diagnosis not present

## 2021-09-15 DIAGNOSIS — M9903 Segmental and somatic dysfunction of lumbar region: Secondary | ICD-10-CM | POA: Diagnosis not present

## 2021-10-01 DIAGNOSIS — E119 Type 2 diabetes mellitus without complications: Secondary | ICD-10-CM | POA: Diagnosis not present

## 2021-10-01 DIAGNOSIS — M545 Low back pain, unspecified: Secondary | ICD-10-CM | POA: Diagnosis not present

## 2021-10-22 ENCOUNTER — Other Ambulatory Visit: Payer: BC Managed Care – PPO | Admitting: *Deleted

## 2021-10-22 DIAGNOSIS — I10 Essential (primary) hypertension: Secondary | ICD-10-CM | POA: Diagnosis not present

## 2021-10-23 LAB — BASIC METABOLIC PANEL
BUN/Creatinine Ratio: 19 (ref 9–20)
BUN: 19 mg/dL (ref 6–24)
CO2: 24 mmol/L (ref 20–29)
Calcium: 9.3 mg/dL (ref 8.7–10.2)
Chloride: 102 mmol/L (ref 96–106)
Creatinine, Ser: 0.99 mg/dL (ref 0.76–1.27)
Glucose: 106 mg/dL — ABNORMAL HIGH (ref 70–99)
Potassium: 4.4 mmol/L (ref 3.5–5.2)
Sodium: 141 mmol/L (ref 134–144)
eGFR: 88 mL/min/{1.73_m2} (ref >59)

## 2021-10-25 ENCOUNTER — Other Ambulatory Visit: Payer: Self-pay | Admitting: Physician Assistant

## 2021-10-25 DIAGNOSIS — R7303 Prediabetes: Secondary | ICD-10-CM

## 2021-10-25 DIAGNOSIS — E782 Mixed hyperlipidemia: Secondary | ICD-10-CM

## 2021-10-25 DIAGNOSIS — R911 Solitary pulmonary nodule: Secondary | ICD-10-CM

## 2021-10-25 DIAGNOSIS — I10 Essential (primary) hypertension: Secondary | ICD-10-CM

## 2021-10-29 ENCOUNTER — Ambulatory Visit (INDEPENDENT_AMBULATORY_CARE_PROVIDER_SITE_OTHER)
Admission: RE | Admit: 2021-10-29 | Discharge: 2021-10-29 | Disposition: A | Discharge status: Home / Self Care | Payer: BC Managed Care – PPO | Source: Ambulatory Visit | Attending: Cardiovascular Disease | Admitting: Cardiovascular Disease

## 2021-10-29 DIAGNOSIS — R911 Solitary pulmonary nodule: Secondary | ICD-10-CM

## 2021-10-29 MED ORDER — IOHEXOL 350 MG/ML SOLN
100.0000 mL | Freq: Once | INTRAVENOUS | Status: AC | PRN
  Administered 2021-10-29: 100 mL via INTRAVENOUS

## 2021-11-05 DIAGNOSIS — R7303 Prediabetes: Secondary | ICD-10-CM | POA: Diagnosis not present

## 2021-11-05 DIAGNOSIS — I7123 Aneurysm of the descending thoracic aorta, without rupture: Secondary | ICD-10-CM | POA: Diagnosis not present

## 2021-11-05 DIAGNOSIS — E782 Mixed hyperlipidemia: Secondary | ICD-10-CM | POA: Diagnosis not present

## 2021-11-05 DIAGNOSIS — I10 Essential (primary) hypertension: Secondary | ICD-10-CM | POA: Diagnosis not present

## 2021-12-17 DIAGNOSIS — R109 Unspecified abdominal pain: Secondary | ICD-10-CM | POA: Diagnosis not present

## 2021-12-17 DIAGNOSIS — L282 Other prurigo: Secondary | ICD-10-CM | POA: Diagnosis not present

## 2021-12-17 DIAGNOSIS — N529 Male erectile dysfunction, unspecified: Secondary | ICD-10-CM | POA: Diagnosis not present

## 2022-02-27 DIAGNOSIS — R6882 Decreased libido: Secondary | ICD-10-CM | POA: Diagnosis not present

## 2022-02-27 DIAGNOSIS — Z125 Encounter for screening for malignant neoplasm of prostate: Secondary | ICD-10-CM | POA: Diagnosis not present

## 2022-02-27 DIAGNOSIS — E349 Endocrine disorder, unspecified: Secondary | ICD-10-CM | POA: Diagnosis not present

## 2022-05-13 DIAGNOSIS — E782 Mixed hyperlipidemia: Secondary | ICD-10-CM | POA: Diagnosis not present

## 2022-05-13 DIAGNOSIS — Z0000 Encounter for general adult medical examination without abnormal findings: Secondary | ICD-10-CM | POA: Diagnosis not present

## 2022-05-13 DIAGNOSIS — R7303 Prediabetes: Secondary | ICD-10-CM | POA: Diagnosis not present

## 2022-05-13 DIAGNOSIS — I10 Essential (primary) hypertension: Secondary | ICD-10-CM | POA: Diagnosis not present

## 2022-05-13 DIAGNOSIS — Z125 Encounter for screening for malignant neoplasm of prostate: Secondary | ICD-10-CM | POA: Diagnosis not present

## 2022-05-13 DIAGNOSIS — G4733 Obstructive sleep apnea (adult) (pediatric): Secondary | ICD-10-CM | POA: Diagnosis not present

## 2022-07-22 DIAGNOSIS — K219 Gastro-esophageal reflux disease without esophagitis: Secondary | ICD-10-CM | POA: Diagnosis not present

## 2022-08-03 DIAGNOSIS — M5431 Sciatica, right side: Secondary | ICD-10-CM | POA: Diagnosis not present

## 2022-08-03 DIAGNOSIS — M9903 Segmental and somatic dysfunction of lumbar region: Secondary | ICD-10-CM | POA: Diagnosis not present

## 2022-08-03 DIAGNOSIS — M5416 Radiculopathy, lumbar region: Secondary | ICD-10-CM | POA: Diagnosis not present

## 2022-08-03 DIAGNOSIS — M9905 Segmental and somatic dysfunction of pelvic region: Secondary | ICD-10-CM | POA: Diagnosis not present

## 2022-08-05 DIAGNOSIS — M9905 Segmental and somatic dysfunction of pelvic region: Secondary | ICD-10-CM | POA: Diagnosis not present

## 2022-08-05 DIAGNOSIS — M9903 Segmental and somatic dysfunction of lumbar region: Secondary | ICD-10-CM | POA: Diagnosis not present

## 2022-08-05 DIAGNOSIS — M5431 Sciatica, right side: Secondary | ICD-10-CM | POA: Diagnosis not present

## 2022-08-05 DIAGNOSIS — M5416 Radiculopathy, lumbar region: Secondary | ICD-10-CM | POA: Diagnosis not present

## 2022-08-17 DIAGNOSIS — M5416 Radiculopathy, lumbar region: Secondary | ICD-10-CM | POA: Diagnosis not present

## 2022-08-17 DIAGNOSIS — M9903 Segmental and somatic dysfunction of lumbar region: Secondary | ICD-10-CM | POA: Diagnosis not present

## 2022-08-17 DIAGNOSIS — M9905 Segmental and somatic dysfunction of pelvic region: Secondary | ICD-10-CM | POA: Diagnosis not present

## 2022-08-17 DIAGNOSIS — M5431 Sciatica, right side: Secondary | ICD-10-CM | POA: Diagnosis not present

## 2022-08-21 ENCOUNTER — Ambulatory Visit: Payer: BC Managed Care – PPO | Attending: Cardiovascular Disease | Admitting: Cardiovascular Disease

## 2022-08-21 ENCOUNTER — Encounter: Payer: Self-pay | Admitting: Cardiovascular Disease

## 2022-08-21 VITALS — BP 126/82 | HR 63 | Ht 66.5 in | Wt 226.0 lb

## 2022-08-21 DIAGNOSIS — G4733 Obstructive sleep apnea (adult) (pediatric): Secondary | ICD-10-CM

## 2022-08-21 DIAGNOSIS — I10 Essential (primary) hypertension: Secondary | ICD-10-CM | POA: Diagnosis not present

## 2022-08-21 DIAGNOSIS — I77810 Thoracic aortic ectasia: Secondary | ICD-10-CM

## 2022-08-21 NOTE — Progress Notes (Signed)
Chief Complaint  Patient presents with   Follow-up    Thoracic aortic aneurysm   History of Present Illness: 62 yo male with history of GERD, obesity, sleep apnea, dilated ascending aorta and hyperlipidemia who is here today for follow follow up. He had been followed by Dr. Meda Coffee. I met him in February 2023. He was seen in 2017 for chest pain and dyspnea. Echo in 2017 with LVEF=55-60% with mildly dilated RV. Coronary CTA in 2019 with no evidence of CAD. His pulmonary artery was dilated suggestive of pulmonary HTN. Echo May 2022 with LVEF=60-65%, no valve disease, dilated aortic root. His most recent chest CTA in May 2023 with unchanged 4.3 cm ascending aorta. Nuclear stress test in May 2022 with no ischemia.   He is here today for follow up. The patient denies any chest pain, dyspnea, palpitations, lower extremity edema, orthopnea, PND, dizziness, near syncope or syncope.   Primary Care Physician: Maurice Small, MD  Past Medical History:  Diagnosis Date   Ascending aorta dilatation ([place])    Balanitis    Dilatation of pulmonic artery [facility])    Essential hypertension    followed by pcp and cardiology   GERD (gastroesophageal reflux disease)    History of 2019 novel coronavirus disease (COVID-19) 02/2020   07-24-2020  per pt moderate symptoms all resolved with exception still no tast/ smell   Hx of major depression    Hyperlipidemia    Hypogonadism, male    OSA (obstructive sleep apnea)    followed by dr a. Ander Slade--- study in epic 10-07-2019 w/ moderate to severe osa , no cpap (pt choice)   Phimosis    Pre-diabetes    Right ventricular dysfunction    resolved on [id number] echo   Umbilical hernia    Wears partial dentures    lower    Past Surgical History:  Procedure Laterality Date   APPENDECTOMY  age 25   CIRCUMCISION N/A 07/26/2020   Procedure: CIRCUMCISION ADULT;  Surgeon: Ceasar Mons, MD;  Location: [facility];  Service: Urology;  Laterality: N/A;   ONLY NEEDS 45 MIN   REVISION AMPUTATION OF FINGER Left 1986   ring finger   TOTAL KNEE ARTHROPLASTY Right 2019   VASECTOMY  2010  approx.    Current Outpatient Medications  Medication Sig Dispense Refill   albuterol (VENTOLIN HFA) 108 (90 Base) MCG/ACT inhaler as needed.     ibuprofen (ADVIL) 200 MG tablet Take 200 mg by mouth every 6 (six) hours as needed.     omeprazole (PRILOSEC) 40 MG capsule Take 40 mg by mouth daily as needed.     rosuvastatin (CRESTOR) 5 MG tablet Take 1 tablet (5 mg total) by mouth daily. 90 tablet 3   valsartan (DIOVAN) 160 MG tablet TAKE 1 TABLET BY MOUTH EVERY DAY 90 tablet 3   No current facility-administered medications for this visit.    Allergies  Allergen Reactions   Septra [Sulfamethoxazole-Trimethoprim] Rash    Social History   Socioeconomic History   Marital status: Married    Spouse name: Not on file   Number of children: Not on file   Years of education: Not on file   Highest education level: Not on file  Occupational History   Not on file  Tobacco Use   Smoking status: Former    Years: 5.00    Types: Cigarettes    Quit date: 07/24/1984    Years since quitting: 38.1   Smokeless tobacco: Former  Types: Sarina Ser    Quit date: 07/24/1984  Vaping Use   Vaping Use: Never used  Substance and Sexual Activity   Alcohol use: Never    Alcohol/week: 0.0 standard drinks of alcohol   Drug use: Not Currently    Comment: last marijiuana age 88s   Sexual activity: Not on file  Other Topics Concern   Not on file  Social History Narrative   Not on file   Social Determinants of Health   Financial Resource Strain: Not on file  Food Insecurity: Not on file  Transportation Needs: Not on file  Physical Activity: Not on file  Stress: Not on file  Social Connections: Not on file  Intimate Partner Violence: Not on file    Family History  Adopted: Yes    Review of Systems:  As stated in the HPI and otherwise negative.   BP 126/82   Pulse  63   Ht 5' 6.5" (1.689 m)   Wt 102.5 kg   SpO2 99%   BMI 35.93 kg/m   Physical Examination:  General: Well developed, well nourished, NAD  HEENT: OP clear, mucus membranes moist  SKIN: warm, dry. No rashes. Neuro: No focal deficits  Musculoskeletal: Muscle strength 5/5 all ext  Psychiatric: Mood and affect normal  Neck: No JVD, no carotid bruits, no thyromegaly, no lymphadenopathy.  Lungs:Clear bilaterally, no wheezes, rhonci, crackles Cardiovascular: Regular rate and rhythm. No murmurs, gallops or rubs. Abdomen:Soft. Bowel sounds present. Non-tender.  Extremities: No lower extremity edema. Pulses are 2 + in the bilateral DP/PT.  EKG:  EKG is ordered today. The ekg ordered today demonstrates NSR, rate 63 bpm. Old inferior T wave inversions unchanged  Echo May 2022:  1. Left ventricular ejection fraction, by estimation, is 60 to 65%. The  left ventricle has normal function. The left ventricle has no regional  wall motion abnormalities. There is mild left ventricular hypertrophy.  Left ventricular diastolic parameters  are consistent with Grade I diastolic dysfunction (impaired relaxation).   2. Right ventricular systolic function is normal. The right ventricular  size is normal.   3. Left atrial size was mildly dilated.   4. The mitral valve is normal in structure. No evidence of mitral valve  regurgitation. No evidence of mitral stenosis.   5. The aortic valve is normal in structure. Aortic valve regurgitation is  not visualized. No aortic stenosis is present.   6. Aortic dilatation noted. There is moderate dilatation of the ascending  aorta, measuring 44 mm.   7. The inferior vena cava is normal in size with greater than 50%  respiratory variability, suggesting right atrial pressure of 3 mmHg.   Recent Labs: 10/22/2021: BUN 19; Creatinine, Ser 0.99; Potassium 4.4; Sodium 141   Lipid Panel    Component Value Date/Time   CHOL 119 10/04/2020 1055   TRIG 122 10/04/2020  1055   HDL 42 10/04/2020 1055   CHOLHDL 2.8 10/04/2020 1055   CHOLHDL 3.8 08/15/2015 0907   VLDL 33 (H) 08/15/2015 0907   LDLCALC 55 10/04/2020 1055     Wt Readings from Last 3 Encounters:  08/21/22 102.5 kg  07/30/21 111.1 kg  11/20/20 112.2 kg    Assessment and Plan:   1. Chest pain: No recent chest pain. His chest pain in the past is not felt to be cardiac related. No evidence of CAD on cardiac CTA in 2019. Negative stress test in 2021.   2. Thoracic aortic aneurysm: 4.3 cm in 2023. Repeat chest CTA May  2024.   3. HTN: BP is well controlled. No changes  4. Sleep apnea: He is on CPAP  Labs/ tests ordered today include:   Orders Placed This Encounter  Procedures   CT ANGIO CHEST AORTA W/CM & OR WO/CM   Basic Metabolic Panel (BMET)   EKG 12-Lead   Disposition:   F/U with me one year.   Signed, Lauree Chandler, MD 08/21/2022 4:39 PM    [address], [place], [place]  [address] Phone: [phone]; Fax: [phone]

## 2022-08-21 NOTE — Patient Instructions (Addendum)
Medication Instructions:  No changes *If you need a refill on your cardiac medications before your next appointment, please call your pharmacy*   Lab Work: BMET prior to ct scan in May 2024   Testing/Procedures: Chest CTA - Aorta ---schedule for May 2024   Follow-Up: At [facility], you and your health needs are our priority.  As part of our continuing mission to provide you with exceptional heart care, we have created designated Provider Care Teams.  These Care Teams include your primary Cardiologist (physician) and Advanced Practice Providers (APPs -  Physician Assistants and Nurse Practitioners) who all work together to provide you with the care you need, when you need it.   Your next appointment:   12 month(s)  Provider:   Lauree Chandler, MD

## 2022-08-26 DIAGNOSIS — G4733 Obstructive sleep apnea (adult) (pediatric): Secondary | ICD-10-CM | POA: Diagnosis not present

## 2022-08-26 DIAGNOSIS — R7303 Prediabetes: Secondary | ICD-10-CM | POA: Diagnosis not present

## 2022-08-26 DIAGNOSIS — I10 Essential (primary) hypertension: Secondary | ICD-10-CM | POA: Diagnosis not present

## 2022-10-21 ENCOUNTER — Other Ambulatory Visit: Payer: BC Managed Care – PPO

## 2022-10-28 ENCOUNTER — Other Ambulatory Visit (HOSPITAL_COMMUNITY): Payer: BC Managed Care – PPO

## 2022-12-14 ENCOUNTER — Ambulatory Visit (HOSPITAL_COMMUNITY)
Admission: RE | Admit: 2022-12-14 | Discharge: 2022-12-14 | Disposition: A | Discharge status: Home / Self Care | Payer: BC Managed Care – PPO | Source: Ambulatory Visit | Attending: Cardiovascular Disease | Admitting: Cardiovascular Disease

## 2022-12-14 ENCOUNTER — Ambulatory Visit: Payer: BC Managed Care – PPO | Attending: Cardiovascular Disease

## 2022-12-14 DIAGNOSIS — I712 Thoracic aortic aneurysm, without rupture, unspecified: Secondary | ICD-10-CM | POA: Diagnosis not present

## 2022-12-14 DIAGNOSIS — I77810 Thoracic aortic ectasia: Secondary | ICD-10-CM | POA: Insufficient documentation

## 2022-12-14 LAB — BASIC METABOLIC PANEL
BUN/Creatinine Ratio: 26 — ABNORMAL HIGH (ref 10–24)
BUN: 21 mg/dL (ref 8–27)
CO2: 23 mmol/L (ref 20–29)
Calcium: 9.3 mg/dL (ref 8.6–10.2)
Chloride: 103 mmol/L (ref 96–106)
Creatinine, Ser: 0.8 mg/dL (ref 0.76–1.27)
Glucose: 112 mg/dL — ABNORMAL HIGH (ref 70–99)
Potassium: 4.8 mmol/L (ref 3.5–5.2)
Sodium: 138 mmol/L (ref 134–144)
eGFR: 101 mL/min/{1.73_m2} (ref >59)

## 2022-12-14 MED ORDER — IOHEXOL 350 MG/ML SOLN
75.0000 mL | Freq: Once | INTRAVENOUS | Status: AC | PRN
  Administered 2022-12-14: 75 mL via INTRAVENOUS

## 2022-12-15 ENCOUNTER — Telehealth: Payer: Self-pay | Admitting: *Deleted

## 2022-12-15 DIAGNOSIS — I77810 Thoracic aortic ectasia: Secondary | ICD-10-CM

## 2022-12-15 NOTE — Telephone Encounter (Signed)
Called patient and reviewed Ct aorta results and plan to repeat in one year.  Order placed.  Pt reported noting some "tired, shortness of breath" after climbing a flight or 1 and half flights of steps.  Something he hadn't noticed before.  Denies chest pain or heaviness and said he has noticed his left arm has been hurting him some.    He has been playing pickle ball and doing fine w that activity.  Reviewed ccta in 2019 showed calcium score of zero and low risk nuc study in 2022.  He is going to continue to monitor and will call if any changes/worsening or new symptoms occur.  Adv I would let Dr. Clifton James know and will call him back if new recommendations.  He is not wearing CPAP - he said he lost weight and his wife says he is not snoring and he feels like he is sleeping good.  Planning to have a repeat sleep study to see if he can stay off of it.

## 2022-12-15 NOTE — Telephone Encounter (Signed)
-----   Message from [name], MD sent at 12/14/2022 11:01 AM EDT ----- No significant change in size of his ascending aorta. Repeat CTA in one year. Cody Villegas

## 2022-12-22 ENCOUNTER — Encounter: Payer: Self-pay | Admitting: Emergency Medicine

## 2022-12-22 ENCOUNTER — Emergency Department
Admission: EM | Admit: 2022-12-22 | Discharge: 2022-12-22 | Disposition: A | Discharge status: Home / Self Care | Payer: BC Managed Care – PPO | Attending: Emergency Medicine | Admitting: Emergency Medicine

## 2022-12-22 ENCOUNTER — Other Ambulatory Visit: Payer: Self-pay

## 2022-12-22 DIAGNOSIS — L03114 Cellulitis of left upper limb: Secondary | ICD-10-CM | POA: Diagnosis not present

## 2022-12-22 DIAGNOSIS — M7989 Other specified soft tissue disorders: Secondary | ICD-10-CM | POA: Diagnosis not present

## 2022-12-22 LAB — CBC WITH DIFFERENTIAL/PLATELET
Abs Immature Granulocytes: 0.08 10*3/uL — ABNORMAL HIGH (ref 0.00–0.07)
Basophils Absolute: 0.1 10*3/uL (ref 0.0–0.1)
Basophils Relative: 1 %
Eosinophils Absolute: 0.2 10*3/uL (ref 0.0–0.5)
Eosinophils Relative: 2 %
HCT: 45.8 % (ref 39.0–52.0)
Hemoglobin: 15.3 g/dL (ref 13.0–17.0)
Immature Granulocytes: 1 %
Lymphocytes Relative: 27 %
Lymphs Abs: 3.6 10*3/uL (ref 0.7–4.0)
MCH: 31 pg (ref 26.0–34.0)
MCHC: 33.4 g/dL (ref 30.0–36.0)
MCV: 92.7 fL (ref 80.0–100.0)
Monocytes Absolute: 1.3 10*3/uL — ABNORMAL HIGH (ref 0.1–1.0)
Monocytes Relative: 10 %
Neutro Abs: 8 10*3/uL — ABNORMAL HIGH (ref 1.7–7.7)
Neutrophils Relative %: 59 %
Platelets: 274 10*3/uL (ref 150–400)
RBC: 4.94 MIL/uL (ref 4.22–5.81)
RDW: 12 % (ref 11.5–15.5)
WBC: 13.3 10*3/uL — ABNORMAL HIGH (ref 4.0–10.5)
nRBC: 0 % (ref 0.0–0.2)

## 2022-12-22 LAB — COMPREHENSIVE METABOLIC PANEL
ALT: 16 U/L (ref 0–44)
AST: 25 U/L (ref 15–41)
Albumin: 4.3 g/dL (ref 3.5–5.0)
Alkaline Phosphatase: 87 U/L (ref 38–126)
Anion gap: 9 (ref 5–15)
BUN: 22 mg/dL — ABNORMAL HIGH (ref 6–20)
CO2: 25 mmol/L (ref 22–32)
Calcium: 9.2 mg/dL (ref 8.9–10.3)
Chloride: 103 mmol/L (ref 98–111)
Creatinine, Ser: 0.81 mg/dL (ref 0.61–1.24)
GFR, Estimated: >60 mL/min (ref >60)
Glucose, Bld: 130 mg/dL — ABNORMAL HIGH (ref 70–99)
Potassium: 3.9 mmol/L (ref 3.5–5.1)
Sodium: 137 mmol/L (ref 135–145)
Total Bilirubin: 1.1 mg/dL (ref 0.3–1.2)
Total Protein: 7.7 g/dL (ref 6.5–8.1)

## 2022-12-22 MED ORDER — DOXYCYCLINE HYCLATE 100 MG PO TABS: 100.0000 mg | ORAL_TABLET | Freq: Two times a day (BID) | ORAL | 0 refills | Status: AC

## 2022-12-22 MED ORDER — DOXYCYCLINE HYCLATE 100 MG PO TABS
100.0000 mg | ORAL_TABLET | Freq: Once | ORAL | Status: AC
  Administered 2022-12-22: 100 mg via ORAL
  Filled 2022-12-22: qty 1

## 2022-12-22 MED ORDER — KETOROLAC TROMETHAMINE 30 MG/ML IJ SOLN
30.0000 mg | Freq: Once | INTRAMUSCULAR | Status: AC
  Administered 2022-12-22: 30 mg via INTRAMUSCULAR
  Filled 2022-12-22: qty 1

## 2022-12-22 MED ORDER — NAPROXEN 500 MG PO TABS: 500.0000 mg | ORAL_TABLET | Freq: Two times a day (BID) | ORAL | 2 refills | Status: AC

## 2022-12-22 NOTE — ED Triage Notes (Signed)
Patient ambulatory to triage with steady gait, without difficulty or distress noted; pt reports that he popped a bump on his left middle finger 2 days ago; now area red, swelling and warmth noted to hand

## 2022-12-22 NOTE — ED Provider Notes (Signed)
[facility] Provider Note    Event Date/Time   First MD Initiated Contact with Patient 12/22/22 0710     (approximate)   History   Hand Pain   HPI  Cody Villegas is a 62 y.o. male who presents with redness and swelling to the left hand.  Patient reports he developed a potentially infected bug bite on the left dorsal middle finger approximately.  He states that he tried to drain it but no fluid came out.  He reports now redness and mild swelling and discomfort to the back of his hand     Physical Exam   Triage Vital Signs: ED Triage Vitals  Enc Vitals Group     BP 12/22/22 0516 (!) 159/100     Pulse Rate 12/22/22 0516 84     Resp 12/22/22 0516 20     Temp 12/22/22 0516 97.6 F (36.4 C)     Temp Source 12/22/22 0516 Oral     SpO2 12/22/22 0516 98 %     Weight 12/22/22 0514 104.3 kg (230 lb)     Height 12/22/22 0514 1.676 m (5\' 6" )     Head Circumference --      Peak Flow --      Pain Score 12/22/22 0515 5     Pain Loc --      Pain Edu? --      Excl. in GC? --     Most recent vital signs: Vitals:   12/22/22 0516  BP: (!) 159/100  Pulse: 84  Resp: 20  Temp: 97.6 F (36.4 C)  SpO2: 98%     General: Awake, no distress.  CV:  Good peripheral perfusion.  Resp:  Normal effort.  Abd:  No distention.  Other:  Left proximal dorsal middle finger small area of induration, no fluctuance with erythema extending to the back of the hand with mild swelling.  No difficulty with finger flexion, no sausage digit   ED Results / Procedures / Treatments   Labs (all labs ordered are listed, but only abnormal results are displayed) Labs Reviewed  CBC WITH DIFFERENTIAL/PLATELET - Abnormal; Notable for the following components:      Result Value   WBC 13.3 (*)    Neutro Abs 8.0 (*)    Monocytes Absolute 1.3 (*)    Abs Immature Granulocytes 0.08 (*)    All other components within normal limits  COMPREHENSIVE METABOLIC PANEL - Abnormal; Notable for  the following components:   Glucose, Bld 130 (*)    BUN 22 (*)    All other components within normal limits     EKG     RADIOLOGY     PROCEDURES:  Critical Care performed:   Procedures   MEDICATIONS ORDERED IN ED: Medications  ketorolac (TORADOL) 30 MG/ML injection 30 mg (30 mg Intramuscular Given 12/22/22 0726)  doxycycline (VIBRA-TABS) tablet 100 mg (100 mg Oral Given 12/22/22 0726)     IMPRESSION / MDM / ASSESSMENT AND PLAN / ED COURSE  I reviewed the triage vital signs and the nursing notes. Patient's presentation is most consistent with acute, uncomplicated illness.  Exam is most consistent with cellulitis, doubt abscess as no fluctuance.  No flexor involvement  Allergy to Bactrim, will treat with doxycycline, close follow-up, return precautions discussed        FINAL CLINICAL IMPRESSION(S) / ED DIAGNOSES   Final diagnoses:  Cellulitis of left hand     Rx / DC Orders   ED Discharge  Orders          Ordered    doxycycline (VIBRA-TABS) 100 MG tablet  2 times daily        12/22/22 0722    naproxen (NAPROSYN) 500 MG tablet  2 times daily with meals        12/22/22 8657             Note:  This document was prepared using Dragon voice recognition software and may include unintentional dictation errors.   Jene Every, MD 12/22/22 [phone]

## 2023-01-29 ENCOUNTER — Telehealth: Payer: Self-pay | Admitting: Cardiovascular Disease

## 2023-01-29 NOTE — Telephone Encounter (Signed)
Pt couldn't remember results from CT so reviewed again.  He is going to start exercising and wanted to make sure no changes in size of aorta that would interfere w exercise.

## 2023-01-29 NOTE — Telephone Encounter (Signed)
Calling to go over his last CT results. Please advise

## 2023-02-09 DIAGNOSIS — H6691 Otitis media, unspecified, right ear: Secondary | ICD-10-CM | POA: Diagnosis not present

## 2023-02-09 DIAGNOSIS — B9689 Other specified bacterial agents as the cause of diseases classified elsewhere: Secondary | ICD-10-CM | POA: Diagnosis not present

## 2023-02-09 DIAGNOSIS — J329 Chronic sinusitis, unspecified: Secondary | ICD-10-CM | POA: Diagnosis not present

## 2023-02-09 DIAGNOSIS — Z1211 Encounter for screening for malignant neoplasm of colon: Secondary | ICD-10-CM | POA: Diagnosis not present

## 2023-02-09 DIAGNOSIS — L299 Pruritus, unspecified: Secondary | ICD-10-CM | POA: Diagnosis not present

## 2023-02-15 DIAGNOSIS — G4733 Obstructive sleep apnea (adult) (pediatric): Secondary | ICD-10-CM | POA: Diagnosis not present

## 2023-03-10 DIAGNOSIS — G4733 Obstructive sleep apnea (adult) (pediatric): Secondary | ICD-10-CM | POA: Diagnosis not present

## 2023-03-20 DIAGNOSIS — K30 Functional dyspepsia: Secondary | ICD-10-CM | POA: Diagnosis not present

## 2023-03-20 DIAGNOSIS — K2960 Other gastritis without bleeding: Secondary | ICD-10-CM | POA: Diagnosis not present

## 2023-03-20 DIAGNOSIS — R300 Dysuria: Secondary | ICD-10-CM | POA: Diagnosis not present

## 2023-03-20 DIAGNOSIS — K591 Functional diarrhea: Secondary | ICD-10-CM | POA: Diagnosis not present

## 2023-03-20 DIAGNOSIS — R197 Diarrhea, unspecified: Secondary | ICD-10-CM | POA: Diagnosis not present

## 2023-03-20 DIAGNOSIS — K2900 Acute gastritis without bleeding: Secondary | ICD-10-CM | POA: Diagnosis not present

## 2023-04-09 DIAGNOSIS — G4733 Obstructive sleep apnea (adult) (pediatric): Secondary | ICD-10-CM | POA: Diagnosis not present

## 2023-05-06 DIAGNOSIS — M9903 Segmental and somatic dysfunction of lumbar region: Secondary | ICD-10-CM | POA: Diagnosis not present

## 2023-05-06 DIAGNOSIS — M5416 Radiculopathy, lumbar region: Secondary | ICD-10-CM | POA: Diagnosis not present

## 2023-05-06 DIAGNOSIS — M5431 Sciatica, right side: Secondary | ICD-10-CM | POA: Diagnosis not present

## 2023-05-06 DIAGNOSIS — M9905 Segmental and somatic dysfunction of pelvic region: Secondary | ICD-10-CM | POA: Diagnosis not present

## 2023-05-10 DIAGNOSIS — G4733 Obstructive sleep apnea (adult) (pediatric): Secondary | ICD-10-CM | POA: Diagnosis not present

## 2023-05-19 DIAGNOSIS — E782 Mixed hyperlipidemia: Secondary | ICD-10-CM | POA: Diagnosis not present

## 2023-05-19 DIAGNOSIS — R7303 Prediabetes: Secondary | ICD-10-CM | POA: Diagnosis not present

## 2023-05-19 DIAGNOSIS — G8929 Other chronic pain: Secondary | ICD-10-CM | POA: Diagnosis not present

## 2023-05-19 DIAGNOSIS — M5441 Lumbago with sciatica, right side: Secondary | ICD-10-CM | POA: Diagnosis not present

## 2023-05-19 DIAGNOSIS — Z125 Encounter for screening for malignant neoplasm of prostate: Secondary | ICD-10-CM | POA: Diagnosis not present

## 2023-05-19 DIAGNOSIS — I10 Essential (primary) hypertension: Secondary | ICD-10-CM | POA: Diagnosis not present

## 2023-05-19 DIAGNOSIS — Z0000 Encounter for general adult medical examination without abnormal findings: Secondary | ICD-10-CM | POA: Diagnosis not present

## 2023-06-08 ENCOUNTER — Telehealth: Payer: Self-pay | Admitting: Cardiovascular Disease

## 2023-06-08 NOTE — Telephone Encounter (Signed)
New Message:        Patient says he needs to know what does he see Dr Clifton James for? 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

## 2023-06-08 NOTE — Telephone Encounter (Signed)
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 Michalene to call him.

## 2023-06-09 DIAGNOSIS — G4733 Obstructive sleep apnea (adult) (pediatric): Secondary | ICD-10-CM | POA: Diagnosis not present

## 2023-06-09 NOTE — Telephone Encounter (Signed)
Form completed and signed by Dr. Clifton James.  Give to front office for scanning and return to patient.

## 2023-06-14 ENCOUNTER — Telehealth: Payer: Self-pay | Admitting: Cardiovascular Disease

## 2023-06-14 DIAGNOSIS — Z23 Encounter for immunization: Secondary | ICD-10-CM | POA: Diagnosis not present

## 2023-06-14 DIAGNOSIS — I10 Essential (primary) hypertension: Secondary | ICD-10-CM | POA: Diagnosis not present

## 2023-06-14 DIAGNOSIS — G4733 Obstructive sleep apnea (adult) (pediatric): Secondary | ICD-10-CM | POA: Diagnosis not present

## 2023-06-14 NOTE — Telephone Encounter (Signed)
Patient picked up paperwork 06/14/2023 10:06am

## 2023-07-10 DIAGNOSIS — G4733 Obstructive sleep apnea (adult) (pediatric): Secondary | ICD-10-CM | POA: Diagnosis not present

## 2023-07-11 DIAGNOSIS — J0100 Acute maxillary sinusitis, unspecified: Secondary | ICD-10-CM | POA: Diagnosis not present

## 2023-07-11 DIAGNOSIS — J209 Acute bronchitis, unspecified: Secondary | ICD-10-CM | POA: Diagnosis not present

## 2023-07-19 DIAGNOSIS — G4733 Obstructive sleep apnea (adult) (pediatric): Secondary | ICD-10-CM | POA: Diagnosis not present

## 2023-08-10 DIAGNOSIS — G4733 Obstructive sleep apnea (adult) (pediatric): Secondary | ICD-10-CM | POA: Diagnosis not present

## 2023-09-07 DIAGNOSIS — G4733 Obstructive sleep apnea (adult) (pediatric): Secondary | ICD-10-CM | POA: Diagnosis not present

## 2023-09-22 DIAGNOSIS — E559 Vitamin D deficiency, unspecified: Secondary | ICD-10-CM | POA: Diagnosis not present

## 2023-09-22 DIAGNOSIS — J0100 Acute maxillary sinusitis, unspecified: Secondary | ICD-10-CM | POA: Diagnosis not present

## 2023-09-22 DIAGNOSIS — Z23 Encounter for immunization: Secondary | ICD-10-CM | POA: Diagnosis not present

## 2023-09-22 DIAGNOSIS — I10 Essential (primary) hypertension: Secondary | ICD-10-CM | POA: Diagnosis not present

## 2023-09-22 DIAGNOSIS — R635 Abnormal weight gain: Secondary | ICD-10-CM | POA: Diagnosis not present

## 2023-09-22 DIAGNOSIS — E7849 Other hyperlipidemia: Secondary | ICD-10-CM | POA: Diagnosis not present

## 2023-09-22 DIAGNOSIS — Z130 Encounter for screening for diseases of the blood and blood-forming organs and certain disorders involving the immune mechanism: Secondary | ICD-10-CM | POA: Diagnosis not present

## 2023-09-22 DIAGNOSIS — R7309 Other abnormal glucose: Secondary | ICD-10-CM | POA: Diagnosis not present

## 2023-09-22 DIAGNOSIS — Z6841 Body Mass Index (BMI) 40.0 and over, adult: Secondary | ICD-10-CM | POA: Diagnosis not present

## 2023-09-22 DIAGNOSIS — Z125 Encounter for screening for malignant neoplasm of prostate: Secondary | ICD-10-CM | POA: Diagnosis not present

## 2023-09-22 DIAGNOSIS — Z1329 Encounter for screening for other suspected endocrine disorder: Secondary | ICD-10-CM | POA: Diagnosis not present

## 2023-09-28 DIAGNOSIS — G4733 Obstructive sleep apnea (adult) (pediatric): Secondary | ICD-10-CM | POA: Diagnosis not present

## 2023-09-28 DIAGNOSIS — E291 Testicular hypofunction: Secondary | ICD-10-CM | POA: Diagnosis not present

## 2023-09-28 DIAGNOSIS — Z1331 Encounter for screening for depression: Secondary | ICD-10-CM | POA: Diagnosis not present

## 2023-09-28 DIAGNOSIS — K219 Gastro-esophageal reflux disease without esophagitis: Secondary | ICD-10-CM | POA: Diagnosis not present

## 2023-09-28 DIAGNOSIS — I10 Essential (primary) hypertension: Secondary | ICD-10-CM | POA: Diagnosis not present

## 2023-10-18 DIAGNOSIS — E7849 Other hyperlipidemia: Secondary | ICD-10-CM | POA: Diagnosis not present

## 2023-10-18 DIAGNOSIS — Z6841 Body Mass Index (BMI) 40.0 and over, adult: Secondary | ICD-10-CM | POA: Diagnosis not present

## 2023-10-26 DIAGNOSIS — Z6841 Body Mass Index (BMI) 40.0 and over, adult: Secondary | ICD-10-CM | POA: Diagnosis not present

## 2023-10-26 DIAGNOSIS — I10 Essential (primary) hypertension: Secondary | ICD-10-CM | POA: Diagnosis not present

## 2023-11-01 DIAGNOSIS — Z6839 Body mass index (BMI) 39.0-39.9, adult: Secondary | ICD-10-CM | POA: Diagnosis not present

## 2023-11-01 DIAGNOSIS — E7849 Other hyperlipidemia: Secondary | ICD-10-CM | POA: Diagnosis not present

## 2023-11-07 DIAGNOSIS — G4733 Obstructive sleep apnea (adult) (pediatric): Secondary | ICD-10-CM | POA: Diagnosis not present

## 2023-12-08 DIAGNOSIS — G4733 Obstructive sleep apnea (adult) (pediatric): Secondary | ICD-10-CM | POA: Diagnosis not present

## 2024-01-25 DIAGNOSIS — Z03818 Encounter for observation for suspected exposure to other biological agents ruled out: Secondary | ICD-10-CM | POA: Diagnosis not present

## 2024-01-25 DIAGNOSIS — B349 Viral infection, unspecified: Secondary | ICD-10-CM | POA: Diagnosis not present

## 2024-01-25 DIAGNOSIS — R509 Fever, unspecified: Secondary | ICD-10-CM | POA: Diagnosis not present

## 2024-01-29 ENCOUNTER — Emergency Department (HOSPITAL_COMMUNITY)

## 2024-01-29 ENCOUNTER — Encounter (HOSPITAL_COMMUNITY): Payer: Self-pay

## 2024-01-29 ENCOUNTER — Emergency Department (HOSPITAL_COMMUNITY)
Admission: EM | Admit: 2024-01-29 | Discharge: 2024-01-29 | Disposition: A | Discharge status: Home / Self Care | Attending: Emergency Medicine | Admitting: Emergency Medicine

## 2024-01-29 ENCOUNTER — Other Ambulatory Visit: Payer: Self-pay

## 2024-01-29 DIAGNOSIS — Z79899 Other long term (current) drug therapy: Secondary | ICD-10-CM | POA: Diagnosis not present

## 2024-01-29 DIAGNOSIS — K602 Anal fissure, unspecified: Secondary | ICD-10-CM

## 2024-01-29 DIAGNOSIS — K5732 Diverticulitis of large intestine without perforation or abscess without bleeding: Secondary | ICD-10-CM | POA: Insufficient documentation

## 2024-01-29 DIAGNOSIS — K600 Acute anal fissure: Secondary | ICD-10-CM | POA: Insufficient documentation

## 2024-01-29 DIAGNOSIS — I10 Essential (primary) hypertension: Secondary | ICD-10-CM | POA: Insufficient documentation

## 2024-01-29 DIAGNOSIS — K5792 Diverticulitis of intestine, part unspecified, without perforation or abscess without bleeding: Secondary | ICD-10-CM | POA: Diagnosis not present

## 2024-01-29 DIAGNOSIS — K6289 Other specified diseases of anus and rectum: Secondary | ICD-10-CM | POA: Diagnosis not present

## 2024-01-29 LAB — COMPREHENSIVE METABOLIC PANEL WITH GFR
ALT: 25 U/L (ref 0–44)
AST: 38 U/L (ref 15–41)
Albumin: 2.9 g/dL — ABNORMAL LOW (ref 3.5–5.0)
Alkaline Phosphatase: 67 U/L (ref 38–126)
Anion gap: 8 (ref 5–15)
BUN: 16 mg/dL (ref 8–23)
CO2: 28 mmol/L (ref 22–32)
Calcium: 8.6 mg/dL — ABNORMAL LOW (ref 8.9–10.3)
Chloride: 103 mmol/L (ref 98–111)
Creatinine, Ser: 0.93 mg/dL (ref 0.61–1.24)
GFR, Estimated: >60 mL/min (ref >60)
Glucose, Bld: 113 mg/dL — ABNORMAL HIGH (ref 70–99)
Potassium: 4 mmol/L (ref 3.5–5.1)
Sodium: 139 mmol/L (ref 135–145)
Total Bilirubin: 0.6 mg/dL (ref 0.0–1.2)
Total Protein: 6.2 g/dL — ABNORMAL LOW (ref 6.5–8.1)

## 2024-01-29 LAB — CBC WITH DIFFERENTIAL/PLATELET
Basophils Absolute: 0.2 K/uL — ABNORMAL HIGH (ref 0.0–0.1)
Basophils Relative: 2 %
Eosinophils Absolute: 0.3 K/uL (ref 0.0–0.5)
Eosinophils Relative: 3 %
HCT: 39.8 % (ref 39.0–52.0)
Hemoglobin: 13.1 g/dL (ref 13.0–17.0)
Lymphocytes Relative: 33 %
Lymphs Abs: 3 K/uL (ref 0.7–4.0)
MCH: 30.7 pg (ref 26.0–34.0)
MCHC: 32.9 g/dL (ref 30.0–36.0)
MCV: 93.2 fL (ref 80.0–100.0)
Monocytes Absolute: 0.4 K/uL (ref 0.1–1.0)
Monocytes Relative: 4 %
Neutro Abs: 5.3 K/uL (ref 1.7–7.7)
Neutrophils Relative %: 58 %
Platelets: 277 K/uL (ref 150–400)
RBC: 4.27 MIL/uL (ref 4.22–5.81)
RDW: 12.3 % (ref 11.5–15.5)
WBC: 9.2 K/uL (ref 4.0–10.5)
nRBC: 0 % (ref 0.0–0.2)

## 2024-01-29 LAB — MAGNESIUM: Magnesium: 1.9 mg/dL (ref 1.7–2.4)

## 2024-01-29 LAB — I-STAT CHEM 8, ED
BUN: 20 mg/dL (ref 8–23)
Calcium, Ion: 1.14 mmol/L — ABNORMAL LOW (ref 1.15–1.40)
Chloride: 103 mmol/L (ref 98–111)
Creatinine, Ser: 0.9 mg/dL (ref 0.61–1.24)
Glucose, Bld: 109 mg/dL — ABNORMAL HIGH (ref 70–99)
HCT: 39 % (ref 39.0–52.0)
Hemoglobin: 13.3 g/dL (ref 13.0–17.0)
Potassium: 3.9 mmol/L (ref 3.5–5.1)
Sodium: 140 mmol/L (ref 135–145)
TCO2: 31 mmol/L (ref 22–32)

## 2024-01-29 MED ORDER — AMOXICILLIN-POT CLAVULANATE 875-125 MG PO TABS: 1.0000 | ORAL_TABLET | Freq: Two times a day (BID) | ORAL | 0 refills | Status: AC

## 2024-01-29 MED ORDER — OXYCODONE-ACETAMINOPHEN 5-325 MG PO TABS: 1.0000 | ORAL_TABLET | Freq: Four times a day (QID) | ORAL | 0 refills | Status: AC | PRN

## 2024-01-29 MED ORDER — OXYCODONE-ACETAMINOPHEN 5-325 MG PO TABS
1.0000 | ORAL_TABLET | Freq: Once | ORAL | Status: AC
  Administered 2024-01-29: 1 via ORAL
  Filled 2024-01-29: qty 1

## 2024-01-29 MED ORDER — IOHEXOL 350 MG/ML SOLN
75.0000 mL | Freq: Once | INTRAVENOUS | Status: AC | PRN
  Administered 2024-01-29: 75 mL via INTRAVENOUS

## 2024-01-29 NOTE — ED Triage Notes (Signed)
 Pt was having bad diarrhea for a few days and now he is c.o pain to his rectum. Some bleeding

## 2024-01-29 NOTE — ED Provider Notes (Signed)
 [place] EMERGENCY DEPARTMENT AT [facility] Provider Note   CSN: [id number] Arrival date & time: 01/29/24  9089     Patient presents with: Rectal Pain   Cody Villegas is a 63 y.o. male.   HPI Patient presents for rectal pain.  Medical history includes HLD, OSA, prediabetes, GERD, HTN, depression.  This past week, he developed diarrhea.  Diarrhea has resolved but he has developed pain and soreness in his rectal area.  He has been using Preparation H and diaper cream.  He has had a scant amount of bleeding.  When standing, he experiences pain in his upper gluteal region.  He denies any systemic symptoms.    Prior to Admission medications   Medication Sig Start Date End Date Taking? Authorizing Provider  amoxicillin -clavulanate (AUGMENTIN ) 875-125 MG tablet Take 1 tablet by mouth every 12 (twelve) hours. 01/29/24  Yes Melvenia Motto, MD  oxyCODONE -acetaminophen  (PERCOCET/ROXICET) 5-325 MG tablet Take 1 tablet by mouth every 6 (six) hours as needed for up to 3 days for severe pain (pain score 7-10). 01/29/24 02/01/24 Yes Melvenia Motto, MD  albuterol (VENTOLIN HFA) 108 (90 Base) MCG/ACT inhaler as needed. 04/17/19   [provider]  doxycycline  (VIBRA -TABS) 100 MG tablet Take 1 tablet (100 mg total) by mouth 2 (two) times daily. 12/22/22   Arlander Charleston, MD  ibuprofen (ADVIL) 200 MG tablet Take 200 mg by mouth every 6 (six) hours as needed.    [provider]  naproxen  (NAPROSYN ) 500 MG tablet Take 1 tablet (500 mg total) by mouth 2 (two) times daily with a meal. 12/22/22   Arlander Charleston, MD  omeprazole (PRILOSEC) 40 MG capsule Take 40 mg by mouth daily as needed.    [provider]  rosuvastatin  (CRESTOR ) 5 MG tablet Take 1 tablet (5 mg total) by mouth daily. 05/08/20   Maranda Leim DEL, MD  valsartan  (DIOVAN ) 160 MG tablet TAKE 1 TABLET BY MOUTH EVERY DAY 10/27/21   Verlin Lonni BIRCH, MD    Allergies: Septra [sulfamethoxazole-trimethoprim]    Review of  Systems  Gastrointestinal:  Positive for anal bleeding and rectal pain.  All other systems reviewed and are negative.   Updated Vital Signs BP (!) 140/80   Pulse 63   Temp (!) 97.5 F (36.4 C) (Temporal)   Resp 18   SpO2 99%   Physical Exam Vitals and nursing note reviewed. Exam conducted with a chaperone present.  Constitutional:      General: He is not in acute distress.    Appearance: Normal appearance. He is well-developed. He is not ill-appearing, toxic-appearing or diaphoretic.  HENT:     Head: Normocephalic and atraumatic.     Right Ear: External ear normal.     Left Ear: External ear normal.     Nose: Nose normal.     Mouth/Throat:     Mouth: Mucous membranes are moist.  Eyes:     Extraocular Movements: Extraocular movements intact.     Conjunctiva/sclera: Conjunctivae normal.  Cardiovascular:     Rate and Rhythm: Normal rate and regular rhythm.  Pulmonary:     Effort: Pulmonary effort is normal. No respiratory distress.  Abdominal:     General: There is no distension.     Palpations: Abdomen is soft.  Genitourinary:    Comments: No skin breakdown or external hemorrhoids.  There does appear to be a small anal fissure at the 6:30 position.  Tenderness is present at area of upper gluteal cleft. Musculoskeletal:  General: No swelling. Normal range of motion.     Cervical back: Normal range of motion and neck supple.  Skin:    General: Skin is warm.     Coloration: Skin is not jaundiced or pale.  Neurological:     General: No focal deficit present.     Mental Status: He is alert and oriented to person, place, and time.  Psychiatric:        Mood and Affect: Mood normal.        Behavior: Behavior normal.     (all labs ordered are listed, but only abnormal results are displayed) Labs Reviewed  COMPREHENSIVE METABOLIC PANEL WITH GFR - Abnormal; Notable for the following components:      Result Value   Glucose, Bld 113 (*)    Calcium  8.6 (*)    Total  Protein 6.2 (*)    Albumin 2.9 (*)    All other components within normal limits  CBC WITH DIFFERENTIAL/PLATELET - Abnormal; Notable for the following components:   Basophils Absolute 0.2 (*)    All other components within normal limits  I-STAT CHEM 8, ED - Abnormal; Notable for the following components:   Glucose, Bld 109 (*)    Calcium , Ion 1.14 (*)    All other components within normal limits  MAGNESIUM    EKG: None  Radiology: CT PELVIS W CONTRAST Result Date: 01/29/2024 EXAM: CT Pelvis, With IV Contrast 01/29/2024 11:58:19 AM TECHNIQUE: Axial images were acquired through the pelvis with IV contrast. Reformatted images were reviewed. Automated exposure control, iterative reconstruction, and/or weight based adjustment of the mA/kV was utilized to reduce the radiation dose to as low as reasonably achievable. COMPARISON: CT of the abdomen and pelvis dated 07/15/2004. CLINICAL HISTORY: Perianal abscess or fistula suspected. Rectal pain bleeding, diarrhea. FINDINGS: BONES: No acute fracture. There is moderate chronic bilateral facet arthrosis at L4-5 and L5-S1. There is degenerative sclerosis within the right pubic symphysis. JOINTS: No dislocation. The joint spaces are normal. SOFT TISSUES: There is a periumbilical fat-containing hernia. INTRAPELVIC CONTENTS: There are numerous colonic diverticula present. There is soft tissue stranding about the distal descending colon and proximal sigmoid colon. There is no evidence of perforation or abscess. IMPRESSION: 1. Soft tissue stranding about the distal descending colon and proximal sigmoid colon, without evidence of perforation or abscess. Findings are consistent with mild diverticulitis. 2. Numerous colonic diverticula. Electronically signed by: evalene coho 01/29/2024 12:04 PM EDT RP Workstation: [id number]     Procedures   Medications Ordered in the ED  oxyCODONE -acetaminophen  (PERCOCET/ROXICET) 5-325 MG per tablet 1 tablet (1 tablet Oral  Given 01/29/24 1036)  iohexol  (OMNIPAQUE ) 350 MG/ML injection 75 mL (75 mLs Intravenous Contrast Given 01/29/24 1158)                                    Medical Decision Making Amount and/or Complexity of Data Reviewed Labs: ordered. Radiology: ordered.  Risk Prescription drug management.   This patient presents to the ED for concern of rectal pain, this involves an extensive number of treatment options, and is a complaint that carries with it a high risk of complications and morbidity.  The differential diagnosis includes contact dermatitis, external hemorrhoids, anal fissure, perirectal abscess   Co morbidities / Chronic conditions that complicate the patient evaluation  HLD, OSA, prediabetes, GERD, HTN, depression   Additional history obtained:  Additional history obtained from EMR External records from outside  source obtained and reviewed including an 8   Lab Tests:  I Ordered, and personally interpreted labs.  The pertinent results include: Normal hemoglobin, no leukocytosis, normal kidney function, normal electrolytes   Imaging Studies ordered:  I ordered imaging studies including CT of pelvis I independently visualized and interpreted imaging which showed mild diverticulitis I agree with the radiologist interpretation   Cardiac Monitoring: / EKG:  The patient was maintained on a cardiac monitor.  I personally viewed and interpreted the cardiac monitored which showed an underlying rhythm of: Sinus rhythm   Problem List / ED Course / Critical interventions / Medication management  Patient presenting for rectal pain.  This is in the setting of his recent persistent diarrhea.  His diarrhea has resolved but he has rectal pain with a scant amount of bleeding.  On arrival, patient is well-appearing.  Rectal exam was performed with chaperone present.  There does appear to be small anal fissure but no other findings in perirectal area.  He has a tenderness in upper gluteal  cleft which is the area of his pain with standing.  CT imaging was ordered to further evaluate.  CT scan did not show any evidence of perirectal abscess or fistula.  Patient does appear to have a mild diverticulitis.  This would explain his recent diarrhea.  Patient was counseled on sitz bath's, barrier creams, and oral pain medication as needed.  He was prescribed Augmentin  for diverticulitis.  He is stable for discharge. I ordered medication including Percocet for analgesia Reevaluation of the patient after these medicines showed that the patient improved I have reviewed the patients home medicines and have made adjustments as needed  Social Determinants of Health:  Lives at home with wife     Final diagnoses:  Diverticulitis  Anal fissure    ED Discharge Orders          Ordered    amoxicillin -clavulanate (AUGMENTIN ) 875-125 MG tablet  Every 12 hours        01/29/24 1234    oxyCODONE -acetaminophen  (PERCOCET/ROXICET) 5-325 MG tablet  Every 6 hours PRN        01/29/24 1234               Melvenia Motto, MD 01/29/24 1239

## 2024-01-29 NOTE — Discharge Instructions (Addendum)
 Your CT scan showed a mild diverticulitis.  A prescription for an antibiotic sent to pharmacy.  Take as prescribed.  A prescription for pain medication was sent as well.  Pain should be treated with over-the-counter ibuprofen and Tylenol  initially.  Take narcotic only as needed.  Utilize sitz bath's before and after bowel movements.  Continue barrier cream.  Take daily fiber supplement to maintain soft stools.  You should set up an appointment with a gastroenterologist for consideration of a colonoscopy once your symptoms improve.  Telephone number is below.  Return to the emergency department for any new or worsening symptoms of concern.

## 2024-02-04 DIAGNOSIS — G4733 Obstructive sleep apnea (adult) (pediatric): Secondary | ICD-10-CM | POA: Diagnosis not present

## 2024-02-04 DIAGNOSIS — K5792 Diverticulitis of intestine, part unspecified, without perforation or abscess without bleeding: Secondary | ICD-10-CM | POA: Diagnosis not present

## 2024-02-04 DIAGNOSIS — K602 Anal fissure, unspecified: Secondary | ICD-10-CM | POA: Diagnosis not present

## 2024-02-04 DIAGNOSIS — Z6841 Body Mass Index (BMI) 40.0 and over, adult: Secondary | ICD-10-CM | POA: Diagnosis not present

## 2024-03-06 DIAGNOSIS — I10 Essential (primary) hypertension: Secondary | ICD-10-CM | POA: Diagnosis not present

## 2024-03-06 DIAGNOSIS — Z23 Encounter for immunization: Secondary | ICD-10-CM | POA: Diagnosis not present

## 2024-03-06 DIAGNOSIS — E119 Type 2 diabetes mellitus without complications: Secondary | ICD-10-CM | POA: Diagnosis not present

## 2024-03-06 DIAGNOSIS — G4733 Obstructive sleep apnea (adult) (pediatric): Secondary | ICD-10-CM | POA: Diagnosis not present

## 2024-03-06 DIAGNOSIS — Z6841 Body Mass Index (BMI) 40.0 and over, adult: Secondary | ICD-10-CM | POA: Diagnosis not present

## 2024-08-30 ENCOUNTER — Ambulatory Visit: Admitting: Cardiovascular Disease
